# Patient Record
Sex: Female | Born: 1970 | Race: White | Hispanic: No | Smoking: Current every day smoker
Health system: Southern US, Community
[De-identification: ages and names within clinical notes are randomized; demographics above are authoritative.]

## PROBLEM LIST (undated history)

## (undated) DIAGNOSIS — K5190 Ulcerative colitis, unspecified, without complications: Secondary | ICD-10-CM

## (undated) HISTORY — PX: EYE SURGERY: SHX253

---

## 2007-05-27 ENCOUNTER — Emergency Department (HOSPITAL_COMMUNITY): Admission: EM | Admit: 2007-05-27 | Discharge: 2007-05-27 | Payer: Self-pay | Admitting: *Deleted

## 2007-06-09 ENCOUNTER — Ambulatory Visit: Payer: Self-pay | Admitting: Family Medicine

## 2007-06-16 ENCOUNTER — Ambulatory Visit: Payer: Self-pay | Admitting: Family Medicine

## 2010-08-09 ENCOUNTER — Inpatient Hospital Stay (HOSPITAL_COMMUNITY): Admission: EM | Admit: 2010-08-09 | Discharge: 2010-08-13 | Payer: Self-pay | Admitting: Emergency Medicine

## 2010-09-11 ENCOUNTER — Ambulatory Visit: Payer: Self-pay | Admitting: Internal Medicine

## 2010-09-11 ENCOUNTER — Encounter: Payer: Self-pay | Admitting: Physician Assistant

## 2010-09-11 DIAGNOSIS — D509 Iron deficiency anemia, unspecified: Secondary | ICD-10-CM | POA: Insufficient documentation

## 2010-09-11 DIAGNOSIS — E8809 Other disorders of plasma-protein metabolism, not elsewhere classified: Secondary | ICD-10-CM | POA: Insufficient documentation

## 2010-09-11 DIAGNOSIS — Z8719 Personal history of other diseases of the digestive system: Secondary | ICD-10-CM

## 2010-09-11 DIAGNOSIS — D539 Nutritional anemia, unspecified: Secondary | ICD-10-CM | POA: Insufficient documentation

## 2010-09-12 LAB — CONVERTED CEMR LAB
AST: 13 units/L (ref 0–37)
Albumin: 4.5 g/dL (ref 3.5–5.2)
BUN: 11 mg/dL (ref 6–23)
Basophils Absolute: 0.1 10*3/uL (ref 0.0–0.1)
Chloride: 107 meq/L (ref 96–112)
Creatinine, Ser: 0.74 mg/dL (ref 0.40–1.20)
Eosinophils Relative: 2 % (ref 0–5)
Glucose, Bld: 75 mg/dL (ref 70–99)
HCT: 41.1 % (ref 36.0–46.0)
Hemoglobin: 14.5 g/dL (ref 12.0–15.0)
Platelets: 243 10*3/uL (ref 150–400)
RBC: 4.13 M/uL (ref 3.87–5.11)
RDW: 11.8 % (ref 11.5–15.5)
WBC: 10 10*3/uL (ref 4.0–10.5)

## 2010-09-26 ENCOUNTER — Encounter: Payer: Self-pay | Admitting: Physician Assistant

## 2011-01-11 NOTE — Assessment & Plan Note (Signed)
Summary: XFU-Colitis; Anemia   Vital Signs:  Patient profile:   40 year old female Height:      65 inches Weight:      128 pounds BMI:     21.38 Temp:     98.1 degrees F oral Pulse rate:   68 / minute Pulse rhythm:   regular Resp:     18 per minute BP sitting:   110 / 68  (left arm) Cuff size:   regular  Vitals Entered By: [place] Shannon (September 11, 2010 3:09 PM) CC: xf/u.... pt wants to know what she can and cannot eat... Is Patient Diabetic? No Pain Assessment Patient in pain? no       Does patient need assistance? Functional Status Self care Ambulation Normal   Primary Care Provider:  Tereso Newcomer, PA-C  CC:  xf/u.... pt wants to know what she can and cannot eat....  History of Present Illness: New patient.  XFU.  Just d/c from [facility] with colitis, likely infectious etiology.  CT results noted below.  O&P and stool culture was negative.  CDiff x 3 was neg.  Hepatitis panel was neg.  HIV was nonreactive.  Her MCV was high.  Her B12 was borderline low.  Her methylmalonic acid was not elevated.  She is taking B12 once daily.  Her hgb was low and her iron levels were low.  She was put on ferrous sulfate.  She had a low protein and albumin.  She notes her diet is ok.  She had an elevated Tbil but f/u labs were WNL  Today she notes she is doing well. Was told not to eat certain things.  That is her only question.  She denies diarrhea.  No bloody stools.  No melena.  No abdominal pain.  She feels back to normal.  She does have a distant family member with Crohns.  No h/o oral ulcers.  No h/o perirectal abscess.    Habits & Providers  Alcohol-Tobacco-Diet     Tobacco Status: current  Exercise-Depression-Behavior     Drug Use: no  Current Medications (verified): 1)  Ferrous Sulfate 325 (65 Fe) Mg Tabs (Ferrous Sulfate) .... One Tab Three Times A Day 2)  Vitamin B-12 1000 Mcg Tabs (Cyanocobalamin) .... Once Daily 3)  Acetaminophen 325 Mg Tabs (Acetaminophen) .... As  Needed  Allergies (verified): No Known Drug Allergies  Past History:  Past Medical History: Anemia-iron deficiency Borderline low B12 (normal MMA level) Macrocytosis  Past Surgical History: eye surgery  Family History: Aunt- Crohn's Dad -prostate CA Grandmother - ?colon CA CAD - Dad s/p PCI; GM s/p PCI DM - grandmother; mom (borderline)  Social History: originally from [place] . . .moved here 4 years ago Current Smoker Alcohol use-no Drug use-no Divorced no kids Occupation: works in Office manager  Smoking Status:  current Drug Use:  no Occupation:  employed  Review of Systems      See HPI General:  Denies chills and fever. CV:  Denies chest pain or discomfort and shortness of breath with exertion. Resp:  Denies cough. GI:  Denies bloody stools, dark tarry stools, and diarrhea. GU:  Denies dysuria.  Physical Exam  General:  alert, well-developed, and well-nourished.   Head:  normocephalic and atraumatic.   Eyes:  pupils equal, pupils round, pupils reactive to light, and no retinal abnormalitiies.   Mouth:  pharynx pink and moist, extremely poor dentition, and teeth missing.   Neck:  supple and no cervical lymphadenopathy.   Lungs:  normal breath sounds, no crackles, and no wheezes.   Heart:  normal rate and regular rhythm.   Abdomen:  soft, non-tender, normal bowel sounds, and no hepatomegaly.   Extremities:  no edema  Neurologic:  alert & oriented X3 and cranial nerves II-XII intact.   Psych:  normally interactive.     Impression & Recommendations:  Problem # 1:  COLITIS, HX OF (ICD-V12.79)  prob infectious has family hx of Crohn's but no first degree relatives w/u essentially negative in the hospital will refer to GI for eval and decide +/- colonoscopy get stool cards  Orders: T-Hemoccult Cards-Multiple ([address]) Gastroenterology Referral (GI)  Problem # 2:  ANEMIA-IRON DEFICIENCY (ICD-280.9)  has heavy cycles ? some of her anemia related to poor  dentition sched CPP continue iron f/u labs today  Her updated medication list for this problem includes:    Ferrous Sulfate 325 (65 Fe) Mg Tabs (Ferrous sulfate) ..... One tab three times a day    Vitamin B-12 1000 Mcg Tabs (Cyanocobalamin) ..... Once daily  Orders: T-CBC w/Diff ([id number])  Problem # 3:  MACROCYTIC ANEMIA (ICD-281.9)  check homocysteine level high MCV may also be related to smoking hx  Her updated medication list for this problem includes:    Ferrous Sulfate 325 (65 Fe) Mg Tabs (Ferrous sulfate) ..... One tab three times a day    Vitamin B-12 1000 Mcg Tabs (Cyanocobalamin) ..... 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 One tab three times a day 2)  Vitamin B-12 1000 Mcg Tabs (Cyanocobalamin) .... Once daily 3)  Acetaminophen 325 Mg Tabs (Acetaminophen) .... As needed  Patient Instructions: 1)  You can slowly advance your diet back to a regular diet. 2)  Tobacco is very bad for your health and your loved ones ! You should stop smoking !  3)  Stop smoking tips: Choose a quit date. Cut down before the quit date. Decide what you will do as a substitute when you feel the urge to smoke(gum, toothpick, exercise).  4)  Please schedule a follow-up appointment in 2 months for CPP.  5)  Someone will call you to arrange an appt with the gastroenterologist.   CT of Abdomen  Procedure date:  08/09/2010  Findings:      IMPRESSION:    1.  Marked circumferential thickening of the walls of the right   colon with mild adjacent inflammatory change and adenopathy.   Findings most consistent with focal colitis  in this clinical   context; infectious colitis and Crohn's disease are leading   considerations.  The terminal hilum appears normal.   2.  No evidence of appendicitis or abscess.   3.  Mild prominence of the left renal collecting system, likely   developmental.  There is no hydronephrosis.  [place] of Abdomen  Procedure date:  08/09/2010  Findings:      IMPRESSION:   Starry-sky appearance of the liver which can be seen with   hepatitis.  Recommend clinical correlation and correlation with   LFTs.    Gallbladder normal.  Appended Document: XFU-Colitis; Anemia  Laboratory Results    Stool - Occult Blood Hemmoccult #1: negative Date: 09/25/2010 Hemoccult #2: negative Date: 09/25/2010 Hemoccult #3:  negative Date: 09/25/2010

## 2011-01-11 NOTE — Letter (Signed)
Summary: *HSN Results Follow up  [facility]  [address] [place], [place] [address]   Phone: [phone]  Fax: [phone]      09/26/2010   [facility] [address] [place], [place]  [address]   Dear  Ms. Sandi Haan,                            ____S.Drinkard,FNP   ____D. Gore,FNP       ____B. McPherson,MD   ____V. Rankins,MD    ____E. Mulberry,MD    ____N. Daphine Deutscher, FNP  ____D. Reche Dixon, MD    ____K. Philipp Deputy, MD    __x__S. Alben Spittle, PA-C     This letter is to inform you that your recent test(s):  _______Pap Smear    ___x____Lab Test     _______X-ray    ___x____ is within acceptable limits  _______ requires a medication change  _______ requires a follow-up lab visit  _______ requires a follow-up visit with your Ramya Vanbergen   Comments:  Stool cards are negative for blood.       _________________________________________________________ If you have any questions, please contact our office                     Sincerely,  Tereso Newcomer PA-C [facility]

## 2011-02-22 LAB — FECAL LACTOFERRIN, QUANT: Fecal Lactoferrin: POSITIVE

## 2011-02-22 LAB — CBC
HCT: 31.1 % — ABNORMAL LOW (ref 36.0–46.0)
HCT: 34.9 % — ABNORMAL LOW (ref 36.0–46.0)
Hemoglobin: 10.8 g/dL — ABNORMAL LOW (ref 12.0–15.0)
Hemoglobin: 11.7 g/dL — ABNORMAL LOW (ref 12.0–15.0)
Hemoglobin: 12.1 g/dL (ref 12.0–15.0)
MCH: 35.2 pg — ABNORMAL HIGH (ref 26.0–34.0)
MCV: 103.5 fL — ABNORMAL HIGH (ref 78.0–100.0)
RBC: 3.01 MIL/uL — ABNORMAL LOW (ref 3.87–5.11)
RBC: 3.26 MIL/uL — ABNORMAL LOW (ref 3.87–5.11)
RDW: 12 % (ref 11.5–15.5)
RDW: 12 % (ref 11.5–15.5)
WBC: 5 10*3/uL (ref 4.0–10.5)
WBC: 9.3 10*3/uL (ref 4.0–10.5)

## 2011-02-22 LAB — DIFFERENTIAL
Eosinophils Absolute: 0.1 10*3/uL (ref 0.0–0.7)
Eosinophils Relative: 1 % (ref 0–5)
Eosinophils Relative: 4 % (ref 0–5)
Lymphocytes Relative: 29 % (ref 12–46)
Lymphs Abs: 0.9 10*3/uL (ref 0.7–4.0)
Lymphs Abs: 1.4 10*3/uL (ref 0.7–4.0)
Monocytes Absolute: 0.4 10*3/uL (ref 0.1–1.0)
Monocytes Relative: 8 % (ref 3–12)
Neutro Abs: 2.9 10*3/uL (ref 1.7–7.7)
Neutrophils Relative %: 87 % — ABNORMAL HIGH (ref 43–77)

## 2011-02-22 LAB — COMPREHENSIVE METABOLIC PANEL
ALT: 11 U/L (ref 0–35)
Albumin: 3.1 g/dL — ABNORMAL LOW (ref 3.5–5.2)
CO2: 24 mEq/L (ref 19–32)
Calcium: 8.1 mg/dL — ABNORMAL LOW (ref 8.4–10.5)
Creatinine, Ser: 0.69 mg/dL (ref 0.4–1.2)
GFR calc Af Amer: >60 mL/min (ref >60)
GFR calc non Af Amer: >60 mL/min (ref >60)
Potassium: 3.6 mEq/L (ref 3.5–5.1)

## 2011-02-22 LAB — HIV ANTIBODY (ROUTINE TESTING W REFLEX): HIV: NONREACTIVE

## 2011-02-22 LAB — RETICULOCYTES
Retic Count, Absolute: 38.3 10*3/uL (ref 19.0–186.0)
Retic Ct Pct: 1.1 % (ref 0.4–3.1)

## 2011-02-22 LAB — IRON AND TIBC: Iron: 11 ug/dL — ABNORMAL LOW (ref 42–135)

## 2011-02-22 LAB — STOOL CULTURE

## 2011-02-22 LAB — BASIC METABOLIC PANEL
BUN: 4 mg/dL — ABNORMAL LOW (ref 6–23)
CO2: 25 mEq/L (ref 19–32)
CO2: 26 mEq/L (ref 19–32)
Calcium: 8.6 mg/dL (ref 8.4–10.5)
Chloride: 115 mEq/L — ABNORMAL HIGH (ref 96–112)
Creatinine, Ser: 0.73 mg/dL (ref 0.4–1.2)
Creatinine, Ser: 0.74 mg/dL (ref 0.4–1.2)
Glucose, Bld: 110 mg/dL — ABNORMAL HIGH (ref 70–99)
Potassium: 3.9 mEq/L (ref 3.5–5.1)
Potassium: 4 mEq/L (ref 3.5–5.1)

## 2011-02-22 LAB — VITAMIN B12: Vitamin B-12: 261 pg/mL (ref 211–911)

## 2011-02-22 LAB — FOLATE: Folate: 4.7 ng/mL

## 2011-02-22 LAB — HEPATITIS PANEL, ACUTE: Hep B C IgM: NEGATIVE

## 2011-02-22 LAB — OVA AND PARASITE EXAMINATION

## 2011-02-22 LAB — CLOSTRIDIUM DIFFICILE EIA: C difficile Toxins A+B, EIA: NEGATIVE

## 2011-02-22 LAB — TECHNOLOGIST SMEAR REVIEW

## 2011-02-22 LAB — BILIRUBIN, FRACTIONATED(TOT/DIR/INDIR)
Indirect Bilirubin: 0.9 mg/dL (ref 0.3–0.9)
Total Bilirubin: 1.1 mg/dL (ref 0.3–1.2)

## 2011-02-22 LAB — FERRITIN: Ferritin: 107 ng/mL (ref 10–291)

## 2011-02-23 LAB — CBC
HCT: 41.6 % (ref 36.0–46.0)
Hemoglobin: 14.7 g/dL (ref 12.0–15.0)
MCHC: 35.3 g/dL (ref 30.0–36.0)
RDW: 11.8 % (ref 11.5–15.5)
WBC: 20 10*3/uL — ABNORMAL HIGH (ref 4.0–10.5)

## 2011-02-23 LAB — DIFFERENTIAL
Basophils Relative: 0 % (ref 0–1)
Eosinophils Absolute: 0.1 10*3/uL (ref 0.0–0.7)
Eosinophils Relative: 1 % (ref 0–5)
Lymphs Abs: 1.5 10*3/uL (ref 0.7–4.0)
Monocytes Relative: 4 % (ref 3–12)

## 2011-02-23 LAB — CULTURE, BLOOD (ROUTINE X 2)
Culture: NO GROWTH
Culture: NO GROWTH

## 2011-02-23 LAB — COMPREHENSIVE METABOLIC PANEL
ALT: 12 U/L (ref 0–35)
AST: 23 U/L (ref 0–37)
Alkaline Phosphatase: 54 U/L (ref 39–117)
CO2: 22 mEq/L (ref 19–32)
Calcium: 9.2 mg/dL (ref 8.4–10.5)
GFR calc Af Amer: >60 mL/min (ref >60)
Glucose, Bld: 101 mg/dL — ABNORMAL HIGH (ref 70–99)
Potassium: 4.9 mEq/L (ref 3.5–5.1)
Sodium: 140 mEq/L (ref 135–145)
Total Protein: 7.1 g/dL (ref 6.0–8.3)

## 2011-02-23 LAB — URINALYSIS, ROUTINE W REFLEX MICROSCOPIC
Bilirubin Urine: NEGATIVE
Glucose, UA: NEGATIVE mg/dL
Specific Gravity, Urine: 1.007 (ref 1.005–1.030)
Urobilinogen, UA: 0.2 mg/dL (ref 0.0–1.0)
pH: 6 (ref 5.0–8.0)

## 2011-02-23 LAB — PROTIME-INR
INR: 1.08 (ref 0.00–1.49)
Prothrombin Time: 14.2 seconds (ref 11.6–15.2)

## 2011-02-23 LAB — HEPATIC FUNCTION PANEL
Bilirubin, Direct: 0.6 mg/dL — ABNORMAL HIGH (ref 0.0–0.3)
Indirect Bilirubin: 1.5 mg/dL — ABNORMAL HIGH (ref 0.3–0.9)
Total Bilirubin: 2.1 mg/dL — ABNORMAL HIGH (ref 0.3–1.2)

## 2011-02-23 LAB — URINE MICROSCOPIC-ADD ON

## 2011-09-26 LAB — CBC
Hemoglobin: 14.7
MCHC: 34.6
Platelets: 239
RDW: 11.2 — ABNORMAL LOW

## 2011-09-26 LAB — COMPREHENSIVE METABOLIC PANEL
ALT: 12
Albumin: 4.1
Calcium: 9.8
GFR calc Af Amer: >60
Glucose, Bld: 117 — ABNORMAL HIGH
Sodium: 141
Total Protein: 7.3

## 2011-09-26 LAB — TSH: TSH: 2.24

## 2011-09-26 LAB — DIFFERENTIAL
Eosinophils Relative: 5
Lymphocytes Relative: 34
Lymphs Abs: 2.7
Monocytes Relative: 7

## 2011-09-26 LAB — LIPASE, BLOOD: Lipase: 37

## 2011-09-26 LAB — BILIRUBIN, DIRECT: Bilirubin, Direct: 0.1

## 2015-10-05 ENCOUNTER — Encounter (HOSPITAL_COMMUNITY): Payer: Self-pay | Admitting: Emergency Medicine

## 2015-10-05 ENCOUNTER — Emergency Department (HOSPITAL_COMMUNITY)
Admission: EM | Admit: 2015-10-05 | Discharge: 2015-10-05 | Disposition: A | Discharge status: Home / Self Care | Payer: Self-pay | Attending: Emergency Medicine | Admitting: Emergency Medicine

## 2015-10-05 DIAGNOSIS — L03213 Periorbital cellulitis: Secondary | ICD-10-CM

## 2015-10-05 DIAGNOSIS — Z3202 Encounter for pregnancy test, result negative: Secondary | ICD-10-CM | POA: Insufficient documentation

## 2015-10-05 DIAGNOSIS — H00036 Abscess of eyelid left eye, unspecified eyelid: Secondary | ICD-10-CM | POA: Insufficient documentation

## 2015-10-05 DIAGNOSIS — Z8719 Personal history of other diseases of the digestive system: Secondary | ICD-10-CM | POA: Insufficient documentation

## 2015-10-05 HISTORY — DX: Ulcerative colitis, unspecified, without complications: K51.90

## 2015-10-05 LAB — CBC WITH DIFFERENTIAL/PLATELET
BASOS ABS: 0 10*3/uL (ref 0.0–0.1)
BASOS PCT: 0 %
EOS PCT: 1 %
Eosinophils Absolute: 0.1 10*3/uL (ref 0.0–0.7)
HCT: 39.5 % (ref 36.0–46.0)
Hemoglobin: 13.7 g/dL (ref 12.0–15.0)
LYMPHS PCT: 21 %
Lymphs Abs: 2.2 10*3/uL (ref 0.7–4.0)
MCH: 34.3 pg — ABNORMAL HIGH (ref 26.0–34.0)
MCHC: 34.7 g/dL (ref 30.0–36.0)
MCV: 99 fL (ref 78.0–100.0)
MONO ABS: 1 10*3/uL (ref 0.1–1.0)
Monocytes Relative: 9 %
Neutro Abs: 7.4 10*3/uL (ref 1.7–7.7)
Neutrophils Relative %: 69 %
PLATELETS: 204 10*3/uL (ref 150–400)
RBC: 3.99 MIL/uL (ref 3.87–5.11)
RDW: 12.3 % (ref 11.5–15.5)
WBC: 10.7 10*3/uL — AB (ref 4.0–10.5)

## 2015-10-05 LAB — BASIC METABOLIC PANEL WITH GFR
Anion gap: 8 (ref 5–15)
BUN: 12 mg/dL (ref 6–20)
CO2: 24 mmol/L (ref 22–32)
Calcium: 9.5 mg/dL (ref 8.9–10.3)
Chloride: 107 mmol/L (ref 101–111)
Creatinine, Ser: 0.75 mg/dL (ref 0.44–1.00)
GFR calc Af Amer: >60 mL/min
GFR calc non Af Amer: >60 mL/min
Glucose, Bld: 117 mg/dL — ABNORMAL HIGH (ref 65–99)
Potassium: 4.2 mmol/L (ref 3.5–5.1)
Sodium: 139 mmol/L (ref 135–145)

## 2015-10-05 LAB — I-STAT TROPONIN, ED: TROPONIN I, POC: 0 ng/mL (ref 0.00–0.08)

## 2015-10-05 LAB — I-STAT BETA HCG BLOOD, ED (MC, WL, AP ONLY)

## 2015-10-05 MED ORDER — SULFAMETHOXAZOLE-TRIMETHOPRIM 800-160 MG PO TABS: 1.0000 | ORAL_TABLET | Freq: Two times a day (BID) | ORAL | Status: AC

## 2015-10-05 MED ORDER — CEPHALEXIN 500 MG PO CAPS: 500.0000 mg | ORAL_CAPSULE | Freq: Four times a day (QID) | ORAL | Status: DC

## 2015-10-05 MED ORDER — SODIUM CHLORIDE 0.9 % IV BOLUS (SEPSIS)
1000.0000 mL | Freq: Once | INTRAVENOUS | Status: AC
Start: 2015-10-05 — End: 2015-10-05
  Administered 2015-10-05: 1000 mL via INTRAVENOUS

## 2015-10-05 NOTE — ED Notes (Signed)
Pt alert,oriented, and ambulatory upon DC. She was advised and encouraged to take the full course of the antibiotics prescribed. [facility]

## 2015-10-05 NOTE — ED Provider Notes (Signed)
CSN: [id number]     Arrival date & time 10/05/15  2129 History   First MD Initiated Contact with Patient 10/05/15 2141     Chief Complaint  Patient presents with  . Facial Swelling  . Tachycardia     (Consider location/radiation/quality/duration/timing/severity/associated sxs/prior Treatment)  HPI  44 year old female who presents with left-sided facial swelling. Is otherwise healthy, and no prior history of immunosuppression. Reports yesterday waking up with some mild left-sided facial swelling around her  left eye and left cheek. This never happened to her before, did not go away today so came in for evaluation. He noted mild tenderness around this area with increased redness. Has not had any headaches, eye pain, decreased visual acuity, double vision,, eye redness. Has had some increased clear drainage from the left eye and increased clear drainage from her nose today. Denies any associating fevers, chills, nausea, vomiting, headache, dental pain, oral swelling, or difficulty breathing. Has been eating and drinking well otherwise. No trauma, no pruritis, no new allergens.  Past Medical History  Diagnosis Date  . Ulcerative colitis [facility](HCC)    Past Surgical History  Procedure Laterality Date  . Eye surgery     History reviewed. No pertinent family history. Social History  Substance Use Topics  . Smoking status: None  . Smokeless tobacco: None  . Alcohol Use: None   OB History    No data available     Review of Systems 10/14 systems reviewed and are negative other than those stated in the HPI    Allergies  Review of patient's allergies indicates no known allergies.  Home Medications   Prior to Admission medications   Medication Sig Start Date End Date Taking? Authorizing Provider  cephALEXin (KEFLEX) 500 MG capsule Take 1 capsule (500 mg total) by mouth 4 (four) times daily. 10/05/15   Lavera Guiseana Duo Adrean Heitz, MD  sulfamethoxazole-trimethoprim (BACTRIM DS,SEPTRA DS) 800-160 MG  tablet Take 1 tablet by mouth 2 (two) times daily. 10/05/15 10/12/15  Lavera Guiseana Duo Harlowe Dowler, MD   BP 115/62 mmHg  Pulse 120  Temp(Src) 97.8 F (36.6 C) (Oral)  Resp 20  SpO2 96% Physical Exam Physical Exam  Nursing note and vitals reviewed. Constitutional: Well developed, well nourished, non-toxic, and in no acute distress Head: Normocephalic and atraumatic. There is left periorbital swelling and mild erythema. Tender to palpation around this area. No open skin or drainage.  Mouth/Throat: Oropharynx is clear and moist.  Neck: Normal range of motion. Neck supple. No meningismus.   Cardiovascular: Tachycardic rate and regular rhythm.   Pulmonary/Chest: Effort normal and breath sounds normal.  Abdominal: Soft. There is no tenderness. There is no rebound and no guarding.  Musculoskeletal: Normal range of motion.  Neurological: Alert, no facial droop, fluent speech, moves all extremities symmetrically Skin: Skin is warm and dry.  Psychiatric: Cooperative  ED Course  Procedures (including critical care time) Labs Review Labs Reviewed  BASIC METABOLIC PANEL - Abnormal; Notable for the following:    Glucose, Bld 117 (*)    All other components within normal limits  CBC WITH DIFFERENTIAL/PLATELET - Abnormal; Notable for the following:    WBC 10.7 (*)    MCH 34.3 (*)    All other components within normal limits  I-STAT CG4 LACTIC ACID, ED  I-STAT BETA HCG BLOOD, ED ([facility], [facility], [facility] ONLY)  I-STAT TROPOININ, ED    Imaging Review No results found. I have personally reviewed and evaluated these images and lab results as part of my medical decision-making.  EKG Interpretation   Date/Time:  Wednesday October 05 2015 21:47:01 EDT Ventricular Rate:  122 PR Interval:  154 QRS Duration: 77 QT Interval:  293 QTC Calculation: 417 R Axis:   91 Text Interpretation:  Sinus tachycardia LAE, consider biatrial enlargement  Aside from tachycardia, no significant change from prior EKG Confirmed by  Vida Nicol  MD, Annabelle Harman ([id number]) on 10/05/2015 10:19:11 PM      MDM   Final diagnoses:  Periorbital cellulitis of left eye    44 year old female, otherwise healthy, who presents with left periorbital swelling and redness. Presentation suggestive of  preseptal cellulitis. No concern for orbital cellulitis, eyes no eye redness, eye pain, limited extraocular movements, or proptosis. She is well-appearing and nontoxic. Initially tachycardic in the 120s, but resolves with IV fluids. Is afebrile with normotension. Minimal leukocytosis, and I do not suspect systemic infection. No other symptoms that would suggest intracranial infection either. I felt that she is appropriate for outpatient management. she is given a course of Keflex and Bactrim. Strict return instructions are reviewed with this patient. She expressed understanding of all discharge instructions and felt comfortable to plan of care.   Lavera Guise, MD 10/05/15 [phone]

## 2015-10-05 NOTE — ED Notes (Signed)
Pt ambulated to restroom with steady gait.

## 2015-10-05 NOTE — Discharge Instructions (Signed)
Return without fail for worsening symptoms, including increased swelling or pain around her eye, eye pain or redness, severe headaches, vomiting unable to keep down food or fluids, fever, vision changes, or any other symptoms concerning to you. Please take antibiotics as prescribed.

## 2015-10-05 NOTE — ED Notes (Signed)
Pt states yesterday began having left sided periorbital swelling increasing into today. Mild swelling around eyelid/left sinus area. Denies injury, difficulty breathing. Pt also tachycardic at 120, states she does not feel like her heart is racing.

## 2015-10-05 NOTE — ED Notes (Signed)
MD at bedside. 

## 2016-05-22 ENCOUNTER — Emergency Department (HOSPITAL_COMMUNITY)
Admission: EM | Admit: 2016-05-22 | Discharge: 2016-05-22 | Disposition: A | Discharge status: Home / Self Care | Payer: Self-pay | Attending: Emergency Medicine | Admitting: Emergency Medicine

## 2016-05-22 ENCOUNTER — Encounter (HOSPITAL_COMMUNITY): Payer: Self-pay | Admitting: Emergency Medicine

## 2016-05-22 DIAGNOSIS — K047 Periapical abscess without sinus: Secondary | ICD-10-CM | POA: Insufficient documentation

## 2016-05-22 MED ORDER — PENICILLIN V POTASSIUM 500 MG PO TABS
500.0000 mg | ORAL_TABLET | Freq: Three times a day (TID) | ORAL | Status: DC
Start: 2016-05-22 — End: 2019-04-23

## 2016-05-22 MED ORDER — PENICILLIN V POTASSIUM 500 MG PO TABS
500.0000 mg | ORAL_TABLET | Freq: Once | ORAL | Status: AC
  Administered 2016-05-22: 500 mg via ORAL
  Filled 2016-05-22: qty 1

## 2016-05-22 NOTE — Discharge Instructions (Signed)
Dental Abscess °A dental abscess is a collection of pus in or around a tooth. °CAUSES °This condition is caused by a bacterial infection around the root of the tooth that involves the inner part of the tooth (pulp). It may result from: °· Severe tooth decay. °· Trauma to the tooth that allows bacteria to enter into the pulp, such as a broken or chipped tooth. °· Severe gum disease around a tooth. °SYMPTOMS °Symptoms of this condition include: °· Severe pain in and around the infected tooth. °· Swelling and redness around the infected tooth, in the mouth, or in the face. °· Tenderness. °· Pus drainage. °· Bad breath. °· Bitter taste in the mouth. °· Difficulty swallowing. °· Difficulty opening the mouth. °· Nausea. °· Vomiting. °· Chills. °· Swollen neck glands. °· Fever. °DIAGNOSIS °This condition is diagnosed with examination of the infected tooth. During the exam, your dentist may tap on the infected tooth. Your dentist will also ask about your medical and dental history and may order X-rays. °TREATMENT °This condition is treated by eliminating the infection. This may be done with: °· Antibiotic medicine. °· A root canal. This may be performed to save the tooth. °· Pulling (extracting) the tooth. This may also involve draining the abscess. This is done if the tooth cannot be saved. °HOME CARE INSTRUCTIONS °· Take medicines only as directed by your dentist. °· If you were prescribed antibiotic medicine, finish all of it even if you start to feel better. °· Rinse your mouth (gargle) often with salt water to relieve pain or swelling. °· Do not drive or operate heavy machinery while taking pain medicine. °· Do not apply heat to the outside of your mouth. °· Keep all follow-up visits as directed by your dentist. This is important. °SEEK MEDICAL CARE IF: °· Your pain is worse and is not helped by medicine. °SEEK IMMEDIATE MEDICAL CARE IF: °· You have a fever or chills. °· Your symptoms suddenly get worse. °· You have a  very bad headache. °· You have problems breathing or swallowing. °· You have trouble opening your mouth. °· You have swelling in your neck or around your eye. °  °This information is not intended to replace advice given to you by your health care provider. Make sure you discuss any questions you have with your health care provider. °  °Document Released: 11/26/2005 Document Revised: 04/12/2015 Document Reviewed: 11/23/2014 °Elsevier Interactive Patient Education ©2016 Elsevier Inc. ° °Dental Care and Dentist Visits °Dental care supports good overall health. Regular dental visits can also help you avoid dental pain, bleeding, infection, and other more serious health problems in the future. It is important to keep the mouth healthy because diseases in the teeth, gums, and other oral tissues can spread to other areas of the body. Some problems, such as diabetes, heart disease, and pre-term labor have been associated with poor oral health.  °See your dentist every 6 months. If you experience emergency problems such as a toothache or broken tooth, go to the dentist right away. If you see your dentist regularly, you may catch problems early. It is easier to be treated for problems in the early stages.  °WHAT TO EXPECT AT A DENTIST VISIT  °Your dentist will look for many common oral health problems and recommend proper treatment. At your regular dental visit, you can expect: °· Gentle cleaning of the teeth and gums. This includes scraping and polishing. This helps to remove the sticky substance around the teeth and gums (  plaque). Plaque forms in the mouth shortly after eating. Over time, plaque hardens on the teeth as tartar. If tartar is not removed regularly, it can cause problems. Cleaning also helps remove stains.  Periodic X-rays. These pictures of the teeth and supporting bone will help your dentist assess the health of your teeth.  Periodic fluoride treatments. Fluoride is a natural mineral shown to help  strengthen teeth. Fluoride treatmentinvolves applying a fluoride gel or varnish to the teeth. It is most commonly done in children.  Examination of the mouth, tongue, jaws, teeth, and gums to look for any oral health problems, such as:  Cavities (dental caries). This is decay on the tooth caused by plaque, sugar, and acid in the mouth. It is best to catch a cavity when it is small.  Inflammation of the gums caused by plaque buildup (gingivitis).  Problems with the mouth or malformed or misaligned teeth.  Oral cancer or other diseases of the soft tissues or jaws. KEEP YOUR TEETH AND GUMS HEALTHY For healthy teeth and gums, follow these general guidelines as well as your dentist's specific advice:  Have your teeth professionally cleaned at the dentist every 6 months.  Brush twice daily with a fluoride toothpaste.  Floss your teeth daily.  Ask your dentist if you need fluoride supplements, treatments, or fluoride toothpaste.  Eat a healthy diet. Reduce foods and drinks with added sugar.  Avoid smoking. TREATMENT FOR ORAL HEALTH PROBLEMS If you have oral health problems, treatment varies depending on the conditions present in your teeth and gums.  Your caregiver will most likely recommend good oral hygiene at each visit.  For cavities, gingivitis, or other oral health disease, your caregiver will perform a procedure to treat the problem. This is typically done at a separate appointment. Sometimes your caregiver will refer you to another dental specialist for specific tooth problems or for surgery. SEEK IMMEDIATE DENTAL CARE IF:  You have pain, bleeding, or soreness in the gum, tooth, jaw, or mouth area.  A permanent tooth becomes loose or separated from the gum socket.  You experience a blow or injury to the mouth or jaw area.   This information is not intended to replace advice given to you by your health care provider. Make sure you discuss any questions you have with your  health care provider.   Document Released: 08/08/2011 Document Revised: 02/18/2012 Document Reviewed: 08/08/2011 Elsevier Interactive Patient Education 2016 [employer] Guide Dental The United [employer] 211 is a great source of information about community services available.  Access by dialing 2-1-1 from anywhere in [place], or by website -  PooledIncome.plwww.nc211.org.   Other Local Resources (Updated 12/2015)  Dental  Care   Services    Phone Number and Address  Cost  [facility] For children 500 - 46 years of age:   Cleaning  Tooth brushing/flossing instruction  Sealants, fillings, crowns  Extractions  Emergency treatment  [phone] [address] [place], [place] [address] Charges based on family income.  Medicaid and some insurance plans accepted.     [facility]  Cleaning  Sealants, fillings, crowns  Extractions  Emergency treatment [phone] [address] [place], [place]  Pregnant women 46 years of age or older with a Medicaid card  [facility] [place]  Cleaning  Sealants, fillings, crowns  Extractions  Emergency treatment [phone] [address] [place], [place] Pregnant women 18  years of age or older with a Medicaid card  [facility] For children 250 - 46 years of age:   Cleaning  Tooth brushing/flossing instruction  Sealants, fillings, crowns  Extractions  Emergency treatment Limited orthodontic services for patients with Medicaid [phone] [address] [place], [place] [address] Medicaid and [facility] Health Choice cover for children up to age 46 and pregnant women.  Parents of children up to age 46 without Medicaid pay a reduced fee at time of service.  [facility] [place] For children 60 - 46 years of age:    Cleaning  Tooth brushing/flossing instruction  Sealants, fillings, crowns  Extractions  Emergency treatment Limited orthodontic services for patients with Medicaid [phone] [address] [place], [place].  Medicaid and [place] Health Choice cover for children up to age 46 and pregnant women.  Parents of children up to age 46 without Medicaid pay a reduced fee.  [facility]  Cleaning  Sealants, fillings, crowns  Extractions  Hours: Tuesdays and Thursdays, 4:15 - 8 pm [phone] [address] [place], [place] [address] Services free of charge to [facility] residents ages 18-64 who do not have health insurance, Medicare, [place], or [place] benefits and fall within federal poverty guidelines  [facility]    Provides dental care in addition to primary medical care, nutritional counseling, and pharmacy:  Nurse, mental healthCleaning  Sealants, fillings, crowns  Extractions                  [phone] [facility], [address] [place], [place]  [phone] Phineas Realharles [facility], [address] [place], [place]  [phone] [facility] [place], [place]  [phone] [facility], [address] [place], [place]  [phone] [facility] [address] [place], [place] Accepts [place], [place]are, most insurance.  Also provides services available to all with fees adjusted based on ability to pay.    [facility]  Cleaning  Tooth brushing/flossing instruction  Sealants, fillings, crowns  Extractions  Emergency treatment Hours: Tuesdays, Thursdays, and Fridays from 8 am to 5 pm by appointment only. [phone] [address] [place], [place] [address] [facility] residents with Medicaid (depending on eligibility) and children with [facility] Health Choice - call for more  information.  [facility]  Extractions only  Hours: 2nd and 4th Thursday of each month from 6:30 am - 9 am.   [phone] [address] [place], [place] [address] Ages 7118 and older only.  Patients are seen on a first come, first served basis.  [employer]  [employer]  Fillings  Extractions  Orthodontics  Endodontics  Implants/Crowns/Bridges  Complete and partial dentures [phone] [place] Patients must complete an application for services.  There is often a waiting list.

## 2016-05-22 NOTE — ED Notes (Signed)
Pt states that she started having R sided facial swelling today. States she has had this before on the L side with a dental issue. No pain this time. Alert and oriented. Airway intact.

## 2016-05-22 NOTE — ED Provider Notes (Signed)
CSN: [id number]     Arrival date & time 05/22/16  0135 History   First MD Initiated Contact with Patient 05/22/16 0404     Chief Complaint  Patient presents with  . Facial Swelling     (Consider location/radiation/quality/duration/timing/severity/associated sxs/prior Treatment) HPI Comments: Patient presents with complaint of right facial swelling and swollen gums for the past 1-2 days. No fever. She denies significant pain, nasal pain or congestion, drainage from gum line or sore throat/difficulty swallowing. No nausea or vomiting.   The history is provided by the patient. No language interpreter was used.    Past Medical History  Diagnosis Date  . Ulcerative colitis [facility](HCC)    Past Surgical History  Procedure Laterality Date  . Eye surgery     History reviewed. No pertinent family history. Social History  Substance Use Topics  . Smoking status: None  . Smokeless tobacco: None  . Alcohol Use: None   OB History    No data available     Review of Systems  Constitutional: Negative for fever and chills.  HENT: Positive for dental problem and facial swelling. Negative for ear pain and trouble swallowing.   Gastrointestinal: Negative.  Negative for nausea and vomiting.  Neurological: Negative.  Negative for headaches.      Allergies  Review of patient's allergies indicates no known allergies.  Home Medications   Prior to Admission medications   Medication Sig Start Date End Date Taking? Authorizing Provider  naproxen sodium (ANAPROX) 220 MG tablet Take 440 mg by mouth 2 (two) times daily as needed (pain).   Yes Historical Provider, MD  cephALEXin (KEFLEX) 500 MG capsule Take 1 capsule (500 mg total) by mouth 4 (four) times daily. Patient not taking: Reported on 05/22/2016 10/05/15   Lavera Guiseana Duo Liu, MD   BP 142/78 mmHg  Pulse 111  Temp(Src) 97.8 F (36.6 C) (Oral)  Resp 18  Ht 5\' 5"  (1.651 m)  SpO2 100%  LMP 05/13/2016 (Exact Date) Physical Exam  Constitutional:  She is oriented to person, place, and time. She appears well-developed and well-nourished.  HENT:  Mouth/Throat: Oropharynx is clear and moist.  Widespread dental decay. There is marked swelling along upper dental line. No active drainage. Right facial swelling of cheek.   Neck: Normal range of motion.  Pulmonary/Chest: Effort normal.  Neurological: She is alert and oriented to person, place, and time.  Skin: Skin is warm and dry.    ED Course  Procedures (including critical care time) Labs Review Labs Reviewed - No data to display  Imaging Review No results found. I have personally reviewed and evaluated these images and lab results as part of my medical decision-making.   EKG Interpretation None      MDM   Final diagnoses:  None    1. Dental abscess  Patient presents with right sided facial and gum swelling without pain or fever. There is marked dental decay and alveolar ridge swelling c/w abscess. Will treat with PCN and provided dental clinic resources.     Elpidio AnisShari Jaleal Schliep, PA-C 05/22/16 [id number]  April Palumbo, MD 05/22/16 [phone]

## 2019-04-23 ENCOUNTER — Ambulatory Visit (HOSPITAL_COMMUNITY)
Admission: EM | Admit: 2019-04-23 | Discharge: 2019-04-23 | Disposition: A | Discharge status: Home / Self Care | Payer: Self-pay

## 2019-04-23 ENCOUNTER — Other Ambulatory Visit: Payer: Self-pay

## 2019-04-23 ENCOUNTER — Encounter (HOSPITAL_COMMUNITY): Payer: Self-pay | Admitting: Family Medicine

## 2019-04-23 DIAGNOSIS — T148XXA Other injury of unspecified body region, initial encounter: Secondary | ICD-10-CM

## 2019-04-23 NOTE — ED Triage Notes (Signed)
Pt present a bump on her right leg upper thigh area. Pt states the bump has been there a while but gotten worst to touch and painful while moving leg.

## 2019-04-23 NOTE — ED Provider Notes (Signed)
[facility]    CSN: [id number] Arrival date & time: 04/23/19  1250     History   Chief Complaint Chief Complaint  Patient presents with  . Leg Swelling    Right     HPI Sheral ApleySandra M Sassano is a 48 y.o. female.   Patient is a 48 year old female with no significant past medical history.  She presents today with knot to the right upper leg just above the knee.  This is been present and worsening over the past week.  Reports it started as a small area and a few days ago she was stretching in bed and felt sharp pain in the area.  Since there has been more swelling, bruising and intermittent pains.  She has been taking Aleve for pain.  She has also been using heat to the area.  She left the heating pad on there all night last night.  She woke up this morning with worsening pain.  Denies any specific injuries.  Denies any fevers, chills, body aches, dizziness or weakness.  ROS per HPI      Past Medical History:  Diagnosis Date  . Ulcerative colitis [facility](HCC)     Patient Active Problem List   Diagnosis Date Noted  . 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 COLITIS, HX OF 09/11/2010    Past Surgical History:  Procedure Laterality Date  . EYE SURGERY      OB History   No obstetric history on file.      Home Medications    Prior to Admission medications   Medication Sig Start Date End Date Taking? Authorizing Provider  naproxen sodium (ANAPROX) 220 MG tablet Take 440 mg by mouth 2 (two) times daily as needed (pain).    [provider]    Family History History reviewed. No pertinent family history.  Social History Social History   Tobacco Use  . Smoking status: Current Every Day Smoker  . Smokeless tobacco: Current User  Substance Use Topics  . Alcohol use: Not on file  . Drug use: Not on file     Allergies   Patient has no known allergies.   Review of Systems Review of Systems   Physical Exam Triage Vital Signs ED Triage Vitals [04/23/19 1331]  Enc Vitals Group     BP 134/82     Pulse Rate (!) 103     Resp 16     Temp 97.9 F (36.6 C)     Temp Source Oral     SpO2 100 %     Weight      Height      Head Circumference      Peak Flow      Pain Score 5     Pain Loc      Pain Edu?      Excl. in GC?    No data found.  Updated Vital Signs BP 134/82 (BP Location: Right Arm)   Pulse (!) 103   Temp 97.9 F (36.6 C) (Oral)   Resp 16   LMP 05/13/2016 (Exact Date)   SpO2 100%   Visual Acuity Right Eye Distance:   Left Eye Distance:   Bilateral Distance:    Right Eye Near:   Left Eye Near:    Bilateral Near:     Physical Exam Vitals signs and nursing note reviewed.  Constitutional:      General: She is not in acute distress.  Appearance: Normal appearance. She is not ill-appearing, toxic-appearing or diaphoretic.  HENT:     Head: Normocephalic and atraumatic.     Nose: Nose normal.  Neck:     Musculoskeletal: Normal range of motion.  Cardiovascular:     Rate and Rhythm: Normal rate and regular rhythm.     Pulses: Normal pulses.  Pulmonary:     Effort: Pulmonary effort is normal.     Breath sounds: Normal breath sounds.  Musculoskeletal: Normal range of motion.  Skin:    General: Skin is warm and dry.  Neurological:     Mental Status: She is alert.  Psychiatric:        Mood and Affect: Mood normal.          UC Treatments / Results  Labs (all labs ordered are listed, but only abnormal results are displayed) Labs Reviewed - No data to display  EKG None  Radiology No results found.  Procedures Procedures (including critical care time)  Medications Ordered in UC Medications - No data to display  Initial Impression / Assessment and Plan / UC Course  I have reviewed the triage vital signs and the nursing notes.  Pertinent labs & imaging results that were available during my care of the patient were reviewed by me and  considered in my medical decision making (see chart for details).     Symptoms consistent with hematoma.  Recommended using ice instead of heat Believe that heat may have made it worse Watch closely over the next couple days for worsening or spreading Aleve for pain Follow up as needed for continued or worsening symptoms  Final Clinical Impressions(s) / UC Diagnoses   Final diagnoses:  Hematoma     Discharge Instructions     I believe  this is a hematoma Start icing instead of heat.  You can take aleve for pain as needed.  Rest the leg Follow up as needed for continued or worsening symptoms     ED Prescriptions    None     Controlled Substance Prescriptions [place] Controlled Substance Registry consulted? Not Applicable   Janace Aris, NP 04/23/19 1519

## 2019-04-23 NOTE — Discharge Instructions (Signed)
I believe  this is a hematoma Start icing instead of heat.  You can take aleve for pain as needed.  Rest the leg Follow up as needed for continued or worsening symptoms

## 2021-03-25 ENCOUNTER — Other Ambulatory Visit: Payer: Self-pay

## 2021-03-25 ENCOUNTER — Emergency Department (HOSPITAL_COMMUNITY): Payer: Self-pay

## 2021-03-25 ENCOUNTER — Emergency Department (HOSPITAL_COMMUNITY)
Admission: EM | Admit: 2021-03-25 | Discharge: 2021-03-25 | Disposition: A | Discharge status: Home / Self Care | Payer: Self-pay | Attending: Emergency Medicine | Admitting: Emergency Medicine

## 2021-03-25 DIAGNOSIS — S8011XA Contusion of right lower leg, initial encounter: Secondary | ICD-10-CM

## 2021-03-25 DIAGNOSIS — S7011XA Contusion of right thigh, initial encounter: Secondary | ICD-10-CM | POA: Insufficient documentation

## 2021-03-25 DIAGNOSIS — Y990 Civilian activity done for income or pay: Secondary | ICD-10-CM | POA: Insufficient documentation

## 2021-03-25 DIAGNOSIS — S60211A Contusion of right wrist, initial encounter: Secondary | ICD-10-CM | POA: Insufficient documentation

## 2021-03-25 DIAGNOSIS — S60221A Contusion of right hand, initial encounter: Secondary | ICD-10-CM

## 2021-03-25 DIAGNOSIS — S60052A Contusion of left little finger without damage to nail, initial encounter: Secondary | ICD-10-CM | POA: Insufficient documentation

## 2021-03-25 DIAGNOSIS — S60012A Contusion of left thumb without damage to nail, initial encounter: Secondary | ICD-10-CM | POA: Insufficient documentation

## 2021-03-25 DIAGNOSIS — W19XXXA Unspecified fall, initial encounter: Secondary | ICD-10-CM

## 2021-03-25 DIAGNOSIS — W010XXA Fall on same level from slipping, tripping and stumbling without subsequent striking against object, initial encounter: Secondary | ICD-10-CM | POA: Insufficient documentation

## 2021-03-25 DIAGNOSIS — F17200 Nicotine dependence, unspecified, uncomplicated: Secondary | ICD-10-CM | POA: Insufficient documentation

## 2021-03-25 DIAGNOSIS — S6000XA Contusion of unspecified finger without damage to nail, initial encounter: Secondary | ICD-10-CM

## 2021-03-25 MED ORDER — IBUPROFEN 800 MG PO TABS
800.0000 mg | ORAL_TABLET | Freq: Once | ORAL | Status: AC
  Administered 2021-03-25: 800 mg via ORAL
  Filled 2021-03-25: qty 1

## 2021-03-25 NOTE — Discharge Instructions (Addendum)
Apply cold compresses to sore areas for 20 minutes at a time. Take Motrin and Tylenol as needed as directed for pain. Recheck with your doctor in 1 week if pain persists or is not improving.

## 2021-03-25 NOTE — ED Provider Notes (Signed)
[facility]-EMERGENCY DEPT Provider Note   CSN: [id number] Arrival date & time: 03/25/21  9024     History Chief Complaint  Patient presents with  . Fall  . Arm Injury    Michele Vazquez is a 50 y.o. female.  50 year old female presents for evaluation after a fall which occurred yesterday.  Patient was walking through a parking lot when her right knee gave out causing her to fall onto a cement parking block.  Reports pain in her left distal fifth finger and thumb as well as right wrist and right thigh.  Patient has been ambulatory without difficulty since the fall, did not hit head, did not lose consciousness, is not anticoagulated.  No other injuries, complaints, concerns.        Past Medical History:  Diagnosis Date  . Ulcerative colitis [facility])     Patient Active Problem List   Diagnosis Date Noted  . 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 COLITIS, HX OF 09/11/2010    Past Surgical History:  Procedure Laterality Date  . EYE SURGERY       OB History   No obstetric history on file.     No family history on file.  Social History   Tobacco Use  . Smoking status: Current Every Day Smoker  . Smokeless tobacco: Current User    Home Medications Prior to Admission medications   Medication Sig Start Date End Date Taking? Authorizing Provider  naproxen sodium (ANAPROX) 220 MG tablet Take 440 mg by mouth 2 (two) times daily as needed (pain).    [provider]    Allergies    Patient has no known allergies.  Review of Systems   Review of Systems  Constitutional: Negative for fever.  Musculoskeletal: Positive for arthralgias and myalgias. Negative for back pain, gait problem, joint swelling, neck pain and neck stiffness.  Skin: Positive for wound.  Allergic/Immunologic: Negative for immunocompromised state.  Neurological: Negative for dizziness, weakness,  light-headedness, numbness and headaches.  Hematological: Does not bruise/bleed easily.  Psychiatric/Behavioral: Negative for confusion.  All other systems reviewed and are negative.   Physical Exam Updated Vital Signs BP (!) 144/84   Pulse 83   Temp 98.8 F (37.1 C) (Oral)   Resp 18   Ht 5\' 5"  (1.651 m)   Wt 61.2 kg   LMP 05/13/2016 (Exact Date)   SpO2 97%   BMI 22.47 kg/m   Physical Exam Vitals and nursing note reviewed.  Constitutional:      General: She is not in acute distress.    Appearance: She is well-developed. She is not diaphoretic.  HENT:     Head: Normocephalic and atraumatic.  Cardiovascular:     Pulses: Normal pulses.  Pulmonary:     Effort: Pulmonary effort is normal.  Musculoskeletal:        General: Tenderness and signs of injury present. No swelling or deformity.     Comments: Mild tenderness ecchymosis to distal left thumb and distal left fifth finger with sensation intact, brisk capillary refill present, normal range of motion. Right wrist with minor abrasion to dorsal hand with mild tenderness, no snuffbox tenderness, no tenderness to distal radius or ulna.  Sensation intact, full range of motion without pain, brisk capillary refill present in each finger. Mild tenderness with ecchymosis to mid thigh laterally on right leg, states that she had a flashlight in her pocket and fell on this, believes this is a  source of her pain.  No pain with logroll of right hip.  Skin:    General: Skin is warm and dry.  Neurological:     Mental Status: She is alert and oriented to person, place, and time.     Sensory: No sensory deficit.  Psychiatric:        Behavior: Behavior normal.     ED Results / Procedures / Treatments   Labs (all labs ordered are listed, but only abnormal results are displayed) Labs Reviewed - No data to display  EKG None  Radiology DG Wrist Complete Right  Result Date: 03/25/2021 CLINICAL DATA:  50 year old female status post trip  and fall last night with continued pain. EXAM: RIGHT WRIST - COMPLETE 3+ VIEW COMPARISON:  Right hand series the same day. FINDINGS: There is no evidence of fracture or dislocation. There is no evidence of arthropathy or other focal bone abnormality. Soft tissues are unremarkable. IMPRESSION: Negative. Electronically Signed   By: Odessa Fleming M.D.   On: 03/25/2021 09:54   DG Hand Complete Left  Result Date: 03/25/2021 CLINICAL DATA:  50 year old female status post trip and fall last night with continued pain. EXAM: LEFT HAND - COMPLETE 3+ VIEW COMPARISON:  None. FINDINGS: There is no evidence of fracture or dislocation. There is no evidence of arthropathy or other focal bone abnormality. Soft tissues are unremarkable. IMPRESSION: Negative. Electronically Signed   By: Odessa Fleming M.D.   On: 03/25/2021 09:54   DG Femur Min 2 Views Right  Result Date: 03/25/2021 CLINICAL DATA:  50 year old female status post trip and fall last night with continued pain. EXAM: RIGHT FEMUR 2 VIEWS COMPARISON:  CT Abdomen and Pelvis 08/09/2010. FINDINGS: Right femoral head normally located. Proximal right femur intact. Visible right hemipelvis appears intact. Right femoral shaft intact. Bone mineralization is within normal limits. No acute osseous abnormality identified. No soft tissue gas.  View no right knee joint effusion. But on the cross-table lateral there is around 5.1 cm soft tissue density projecting in the medial distal thigh soft tissues of unclear etiology and significance. IMPRESSION: 1. No acute fracture or dislocation identified about the right femur. 2. Indeterminate 5.1 cm round soft tissue density/mass projecting in the medial distal thigh. Query pain or palpable abnormality at that site. Focused ultrasound may be the best initial modality for further evaluation if posttraumatic hematoma is suspected. Electronically Signed   By: Odessa Fleming M.D.   On: 03/25/2021 09:57    Procedures Procedures   Medications Ordered in  ED Medications  ibuprofen (ADVIL) tablet 800 mg (800 mg Oral Given 03/25/21 1007)    ED Course  I have reviewed the triage vital signs and the nursing notes.  Pertinent labs & imaging results that were available during my care of the patient were reviewed by me and considered in my medical decision making (see chart for details).  Clinical Course as of 03/25/21 1054  Sat Mar 25, 2021  2758 50 year old female presents after mechanical fall as above with complaint of pain in her right wrist, left fingers and right thigh. Exam as above, x-rays obtained to evaluate for bony injury.  X-ray left hand and right wrist unremarkable, right thigh x-ray with evidence of hematoma on exam. Offered right wrist splint, patient declines.  Plan is to apply ice to areas for 20 minutes at a time can take Motrin and Tylenol and recheck with PCP if pain is not improving after 1 week. [LM]    Clinical Course User Index [  LM] Alden Hipp   MDM Rules/Calculators/A&P                          Final Clinical Impression(s) / ED Diagnoses Final diagnoses:  Fall, initial encounter  Hematoma of right lower extremity, initial encounter  Contusion of right hand, initial encounter  Contusion of finger without damage to nail, unspecified finger, unspecified laterality, initial encounter    Rx / DC Orders ED Discharge Orders    None       Alden Hipp 03/25/21 1054    Koleen Distance, MD 03/25/21 [phone]

## 2021-03-25 NOTE — ED Triage Notes (Signed)
Patient reports she tripped and fell on concrete at work last night., today her right hand and arm is hurting, and left fingers hurt along with right hip. Patient states pain is 6/10.

## 2022-03-06 ENCOUNTER — Emergency Department (HOSPITAL_COMMUNITY): Payer: Self-pay

## 2022-03-06 ENCOUNTER — Encounter (HOSPITAL_COMMUNITY): Payer: Self-pay | Admitting: Emergency Medicine

## 2022-03-06 ENCOUNTER — Emergency Department (HOSPITAL_COMMUNITY)
Admission: EM | Admit: 2022-03-06 | Discharge: 2022-03-06 | Disposition: A | Discharge status: Home / Self Care | Payer: Self-pay | Attending: Emergency Medicine | Admitting: Emergency Medicine

## 2022-03-06 DIAGNOSIS — S63256A Unspecified dislocation of right little finger, initial encounter: Secondary | ICD-10-CM | POA: Insufficient documentation

## 2022-03-06 DIAGNOSIS — W548XXA Other contact with dog, initial encounter: Secondary | ICD-10-CM | POA: Insufficient documentation

## 2022-03-06 NOTE — ED Triage Notes (Signed)
Per pt, states she was holding her dog on a leash when another dog walked by suddenly yanking her right wrist-swelling and possible deformity ?

## 2022-03-06 NOTE — Discharge Instructions (Addendum)
We have reduced your right little finger on today's visit.  You were placed on a splint for protection as you might redislocated his finger. ? ?We will need to keep the splint in place for the next few days.  You may alternate ibuprofen and Tylenol to help with your symptoms. ?

## 2022-03-06 NOTE — ED Provider Notes (Signed)
?[facility]-EMERGENCY DEPT ?Provider Note ? ? ?CSN: [id number] ?Arrival date & time: 03/06/22  1625 ? ?  ? ?History ? ?Chief Complaint  ?Patient presents with  ? Hand Injury  ? ? ?Michele Vazquez is a 51 y.o. female. ? ?51 y.o female with no PMH presents to the ED with a chief complaint of right pinky pain status post injury.  Patient was ambulating her dog when they suddenly yanked her right wrist, there is a deformity to her right pinky finger noted on my exam.  She did take some Tylenol, without any improvement in symptoms. She has worsening symptoms, decrease in range of motion of the right pinky.  No other injury reported. ? ?The history is provided by the patient and medical records.  ?Hand Injury ?Location:  Finger ?Finger location:  R little finger ?Injury: yes   ?Time since incident:  2 hours ?Handedness:  Right-handed ?Dislocation: yes   ?Tetanus status:  Unknown ?Prior injury to area:  Yes ?Associated symptoms: no fever   ? ?  ? ?Home Medications ?Prior to Admission medications   ?Medication Sig Start Date End Date Taking? Authorizing Provider  ?naproxen sodium (ANAPROX) 220 MG tablet Take 440 mg by mouth 2 (two) times daily as needed (pain).    [provider]  ?   ? ?Allergies    ?Patient has no known allergies.   ? ?Review of Systems   ?Review of Systems  ?Constitutional:  Negative for chills and fever.  ?Musculoskeletal:  Positive for arthralgias.  ?All other systems reviewed and are negative. ? ?Physical Exam ?Updated Vital Signs ?BP (!) 135/92 (BP Location: Left Arm)   Pulse (!) 122   Temp 98 ?F (36.7 ?C) (Oral)   Resp 18   LMP 05/13/2016 (Exact Date)   SpO2 95%  ?Physical Exam ?Vitals and nursing note reviewed.  ?Constitutional:   ?   Appearance: Normal appearance.  ?HENT:  ?   Head: Normocephalic and atraumatic.  ?Cardiovascular:  ?   Rate and Rhythm: Normal rate.  ?Pulmonary:  ?   Effort: Pulmonary effort is normal.  ?Abdominal:  ?   General: Abdomen is flat.   ?Musculoskeletal:     ?   General: Deformity present.  ?   Right hand: Deformity and tenderness present.  ?   Cervical back: Normal range of motion and neck supple.  ?   Comments: Deformity of the right little finger noted. Decrease in ROM. Pulses present.   ?Skin: ?   General: Skin is warm and dry.  ?Neurological:  ?   Mental Status: She is alert and oriented to person, place, and time.  ? ? ?ED Results / Procedures / Treatments   ?Labs ?(all labs ordered are listed, but only abnormal results are displayed) ?Labs Reviewed - No data to display ? ?EKG ?None ? ?Radiology ?DG Finger Little Right ? ?Result Date: 03/06/2022 ?CLINICAL DATA:  Relocation, right pinky finger pain EXAM: RIGHT LITTLE FINGER 2+V COMPARISON:  None. FINDINGS: There is mild soft tissue swelling of the small finger. There is a probable nondisplaced fracture at the base of the distal phalanx with oblique longitudinal extension. Alignment is normal. IMPRESSION: Probable nondisplaced fracture at the base of the fifth digit distal phalanx with oblique longitudinal extension. Normal interphalangeal joint alignment. Mild soft tissue swelling. Electronically Signed   By: Caprice Renshaw M.D.   On: 03/06/2022 17:41   ? ?Procedures ?Reduction of dislocation ? ?Date/Time: 03/06/2022 5:30 PM ?Performed by: Claude Manges, PA-C ?  Authorized by: Claude Manges, PA-C  ?Consent: Verbal consent obtained. ?Patient identity confirmed: verbally with patient ?Local anesthesia used: no ? ?Anesthesia: ?Local anesthesia used: no ? ?Sedation: ?Patient sedated: no ? ?Patient tolerance: patient tolerated the procedure well with no immediate complications ?Comments: Reduction of dislocated right little finger at the DIP.  ? ?  ? ?Medications Ordered in ED ?Medications - No data to display ? ?ED Course/ Medical Decision Making/ A&P ?  ?                        ?Medical Decision Making ?Amount and/or Complexity of Data Reviewed ?Radiology: ordered. ? ? ?Patient presents to the ED status  post right hand injury after walking her dog and its running with the leash wrapped around her right wrist.  Obvious deformity noted around the little finger noted at the DIP joint.  I discussed sedation along with lidocaine injection in order to help with analgesia however patient would like to have this done without any type of pain management.  She did take Tylenol prior to arrival in ED. ? ?I was successfully able to reduce her right little finger at the bedside.  Will obtain x-ray for confirmation.  She will also be placed in a splint in order to help with pain and discomfort.  We discussed RICE therapy, orthopedic follow-up if needed. ? ?Xray of the right little finger: ? ?Probable nondisplaced fracture at the base of the fifth digit distal  ?phalanx with oblique longitudinal extension. Normal interphalangeal  ?joint alignment. Mild soft tissue swelling.  ? ?Patient placed on a static finger splint.  We discussed small fracture on the right little finger.  She is aware she will need to follow-up with orthopedics, given the referral for Dr. Scarlette Ar.  Patient is and agrees to management, patient stable for discharge. ? ? ? ?Portions of this note were generated with Scientist, clinical (histocompatibility and immunogenetics). Dictation errors may occur despite best attempts at proofreading.   ?Final Clinical Impression(s) / ED Diagnoses ?Final diagnoses:  ?Closed dislocation of right little finger  ? ? ?Rx / DC Orders ?ED Discharge Orders   ? ? None  ? ?  ? ? ?  ?Claude Manges, PA-C ?03/06/22 1747 ? ?  ?Terrilee Files, MD ?03/07/22 1408 ? ?

## 2022-03-29 DIAGNOSIS — M79644 Pain in right finger(s): Secondary | ICD-10-CM | POA: Insufficient documentation

## 2022-03-29 DIAGNOSIS — S63289A Dislocation of proximal interphalangeal joint of unspecified finger, initial encounter: Secondary | ICD-10-CM | POA: Insufficient documentation

## 2022-10-22 IMAGING — CR DG WRIST COMPLETE 3+V*R*
4 series · 4 of 4 positions shown · non-contrast
Comparison: Right hand series the same day.

CLINICAL DATA: 50-year-old female status post trip and fall last
night with continued pain.

EXAM:
RIGHT WRIST - COMPLETE 3+ VIEW

[x wrist pa right]
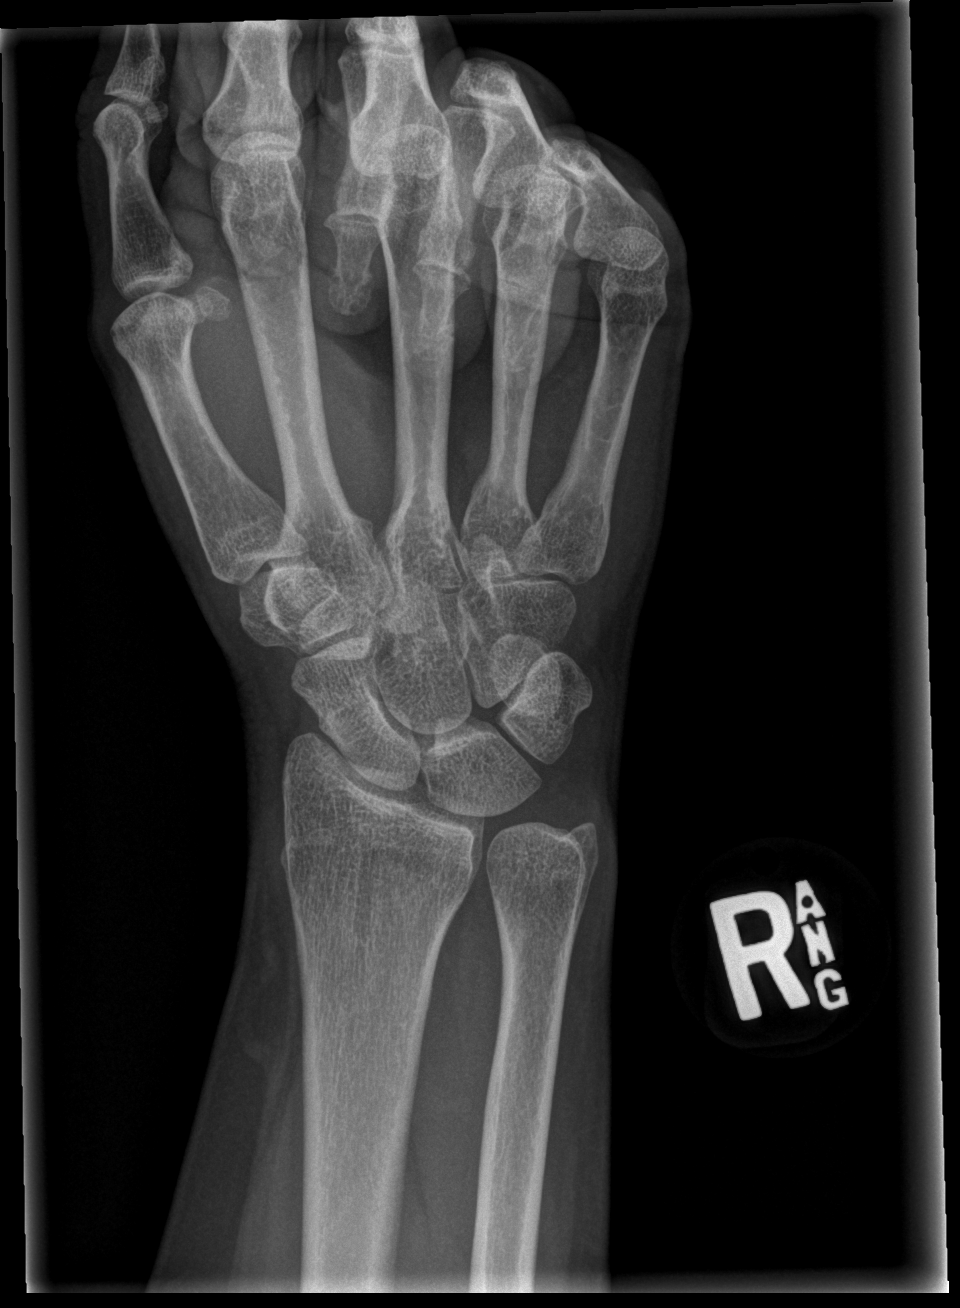

[x wrist obl right]
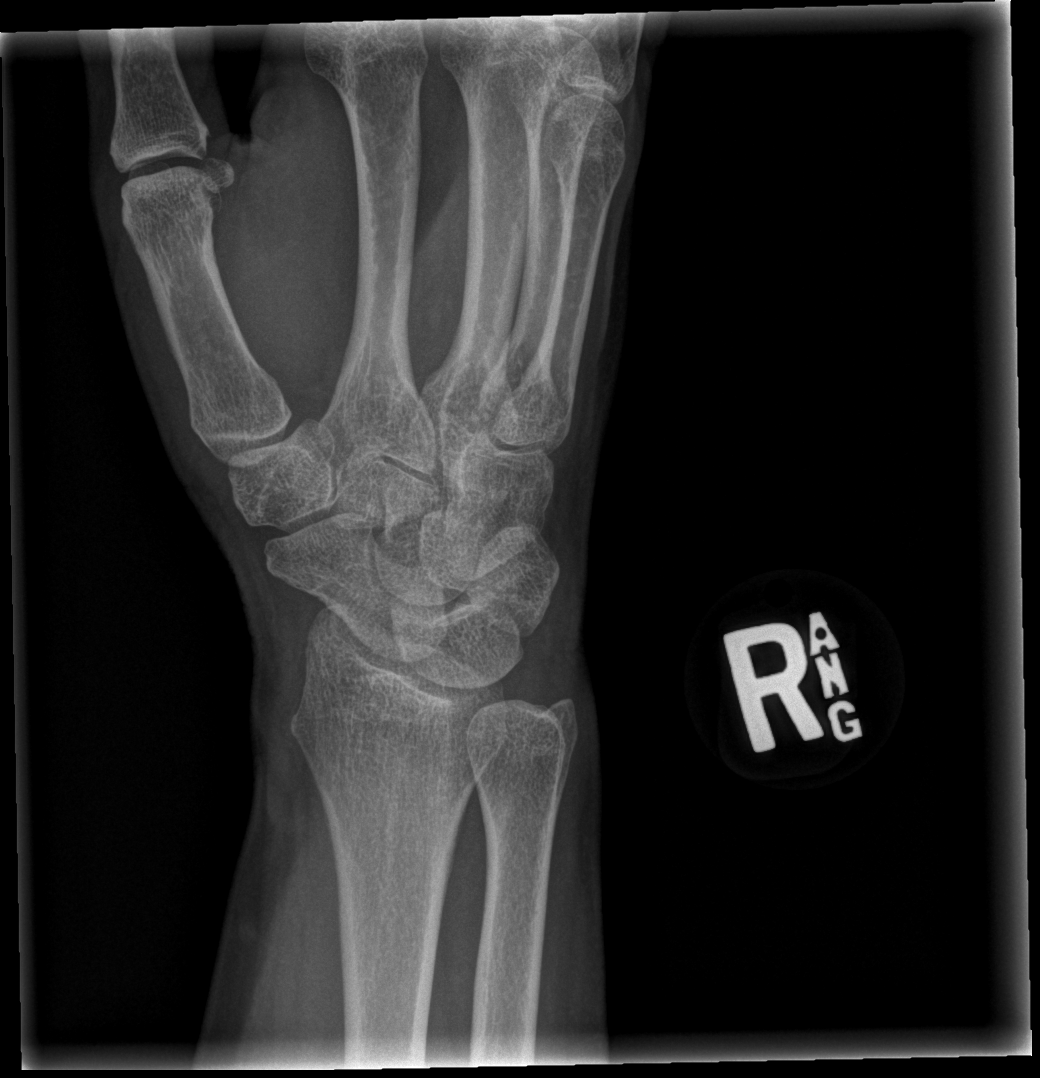

[x wrist lat right]
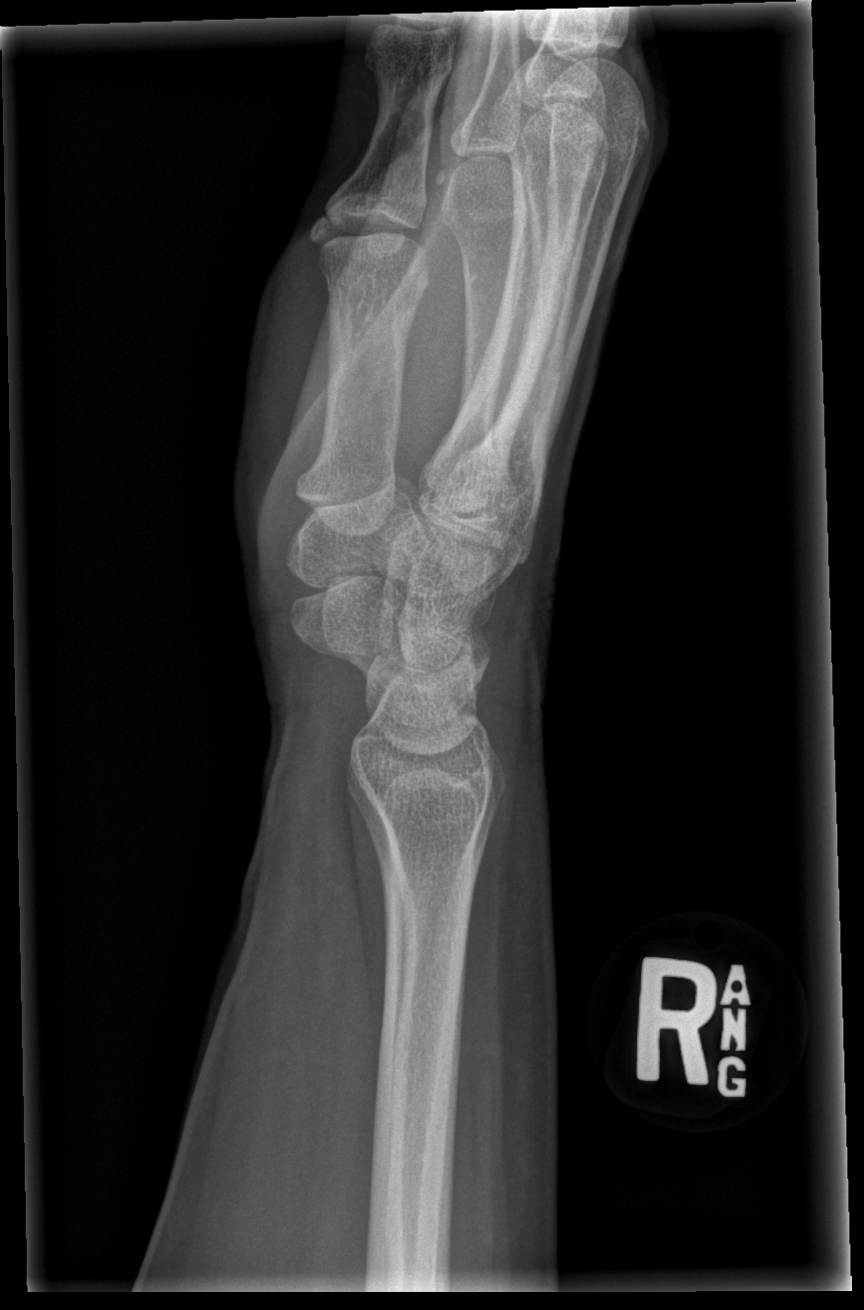

[x wrist navicular view right]
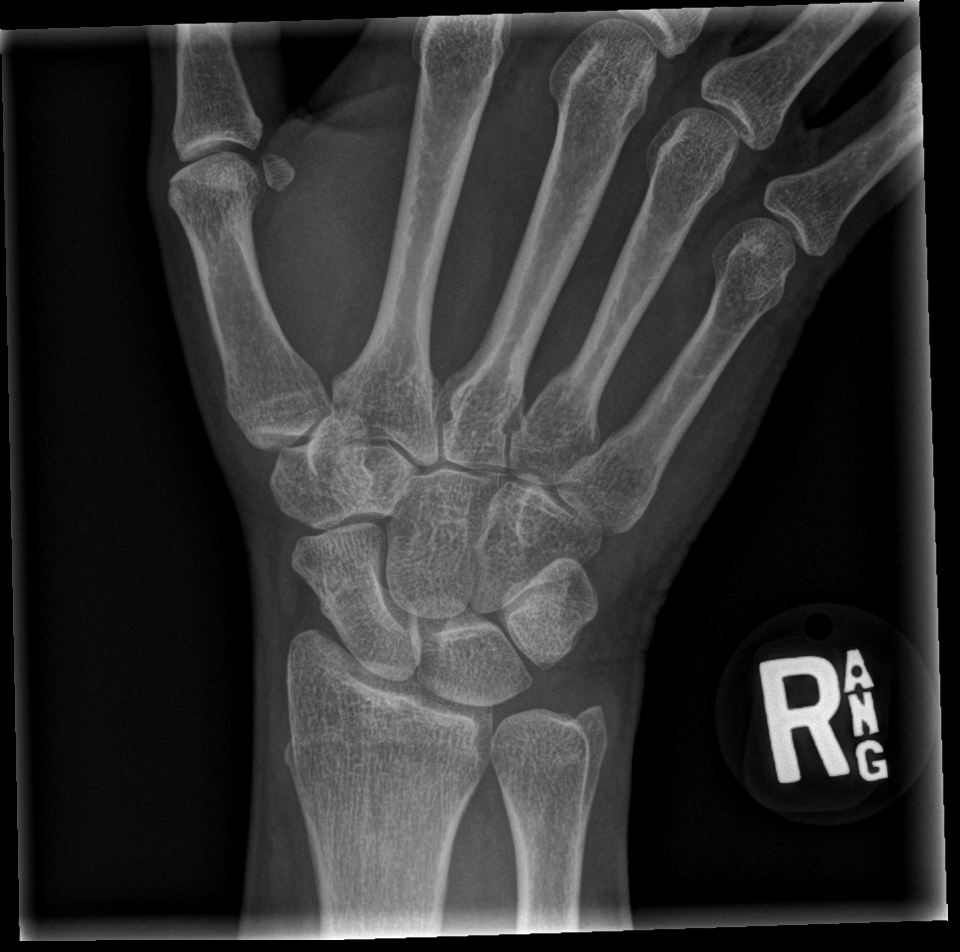

[4 of 4 positions shown; findings below may reference images not displayed]

FINDINGS: There is no evidence of fracture or dislocation. There is no
evidence of arthropathy or other focal bone abnormality. Soft
tissues are unremarkable.
IMPRESSION: Negative.

## 2022-11-04 ENCOUNTER — Other Ambulatory Visit: Payer: Self-pay

## 2022-11-04 ENCOUNTER — Encounter (HOSPITAL_COMMUNITY): Payer: Self-pay | Admitting: Emergency Medicine

## 2022-11-04 ENCOUNTER — Emergency Department (HOSPITAL_COMMUNITY)
Admission: EM | Admit: 2022-11-04 | Discharge: 2022-11-04 | Disposition: A | Discharge status: Home / Self Care | Payer: Commercial Managed Care - HMO | Attending: Emergency Medicine | Admitting: Emergency Medicine

## 2022-11-04 ENCOUNTER — Emergency Department (HOSPITAL_COMMUNITY): Payer: Commercial Managed Care - HMO

## 2022-11-04 DIAGNOSIS — M7521 Bicipital tendinitis, right shoulder: Secondary | ICD-10-CM | POA: Diagnosis not present

## 2022-11-04 DIAGNOSIS — Y93K1 Activity, walking an animal: Secondary | ICD-10-CM | POA: Insufficient documentation

## 2022-11-04 DIAGNOSIS — S40011A Contusion of right shoulder, initial encounter: Secondary | ICD-10-CM | POA: Insufficient documentation

## 2022-11-04 DIAGNOSIS — M25511 Pain in right shoulder: Secondary | ICD-10-CM | POA: Diagnosis present

## 2022-11-04 DIAGNOSIS — M6790 Unspecified disorder of synovium and tendon, unspecified site: Secondary | ICD-10-CM

## 2022-11-04 DIAGNOSIS — W010XXA Fall on same level from slipping, tripping and stumbling without subsequent striking against object, initial encounter: Secondary | ICD-10-CM | POA: Insufficient documentation

## 2022-11-04 MED ORDER — IBUPROFEN 600 MG PO TABS: 600.0000 mg | ORAL_TABLET | Freq: Four times a day (QID) | ORAL | 0 refills | Status: DC

## 2022-11-04 MED ORDER — NAPROXEN 500 MG PO TABS
500.0000 mg | ORAL_TABLET | Freq: Once | ORAL | Status: AC
  Administered 2022-11-04: 500 mg via ORAL
  Filled 2022-11-04: qty 1

## 2022-11-04 NOTE — Discharge Instructions (Signed)
The Xray of your shoulder is normal. Please take the medicine prescribed for pain control. Please call the Orthopedic doctor for a follow up in 2 weeks. Ice the shoulder 4 times a day.  No lifting more than 5 lbs.

## 2022-11-04 NOTE — ED Triage Notes (Signed)
Patient was taking her dog out this morning, she jumped and ran, she fell and her right arm slid and pop. Right arm pain from elbow to shoulder. Took aspirin this morning.

## 2022-11-04 NOTE — ED Provider Notes (Signed)
[facility]-EMERGENC[facility] DEPT Provider Note   CSN: [id number] Arrival date & time: 11/04/22  1351     History  No chief complaint on file.   Michele Vazquez is a 51 y.o. female.  HPI    Pt comes in w/ cc of shoulder pain. Patient states that she had a mechanical fall this morning while walking her dog.  The dog got excited, started running and pulled her.  Patient had a FOOSH mechanism injury and had pain yesterday to her right shoulder.  She also heard a pop.  Initially the pain was tolerable, but over time the pain has gotten worse.  Patient has no medical problems.  She does not take any blood thinners.  She denies any head trauma, headache, neck pain, focal weakness or numbness, nausea, vomiting, vision change.  She denies any chest pain, shortness of breath.  She has ambulated since the accident without any issues.   Home Medications Prior to Admission medications   Medication Sig Start Date End Date Taking? Authorizing Provider  ibuprofen (ADVIL) 600 MG tablet Take 1 tablet (600 mg total) by mouth 4 (four) times daily. 11/04/22  Yes Jereme Loren, Janey Genta, MD  naproxen sodium (ANAPROX) 220 MG tablet Take 440 mg by mouth 2 (two) times daily as needed (pain).    [provider]      Allergies    Patient has no known allergies.    Review of Systems   Review of Systems  Physical Exam Updated Vital Signs BP 125/77 (BP Location: Left Arm)   Pulse 88   Temp 98.2 F (36.8 C) (Oral)   Resp 16   Ht 5\' 5"  (1.651 m)   Wt 61.2 kg   LMP 05/13/2016 (Exact Date)   SpO2 93%   BMI 22.47 kg/m  Physical Exam Vitals and nursing note reviewed.  Constitutional:      Appearance: She is well-developed.  HENT:     Head: Atraumatic.  Cardiovascular:     Rate and Rhythm: Normal rate.  Pulmonary:     Effort: Pulmonary effort is normal.  Musculoskeletal:        General: Swelling and tenderness present.     Cervical back: Normal range of motion and neck  supple.     Comments: Patient has reproducible tenderness with palpation of the right shoulder anteriorly.  She also has tenderness with abduction, forward flexion and internal rotation of the shoulder.  Skin:    General: Skin is warm and dry.  Neurological:     Mental Status: She is alert and oriented to person, place, and time.     ED Results / Procedures / Treatments   Labs (all labs ordered are listed, but only abnormal results are displayed) Labs Reviewed - No data to display  EKG None  Radiology DG Shoulder Right  Result Date: 11/04/2022 CLINICAL DATA:  11/06/2022. Right shoulder pain. EXAM: RIGHT SHOULDER - 2+ VIEW COMPARISON:  None Available. FINDINGS: The glenohumeral and AC joints are intact. No acute fracture is identified. The visualized right ribs are intact and the visualized right lung is clear. IMPRESSION: No acute bony findings. Electronically Signed   By: Larey Seat M.D.   On: 11/04/2022 15:01   DG Humerus Right  Result Date: 11/04/2022 CLINICAL DATA:  Right arm injury. EXAM: RIGHT HUMERUS - 2+ VIEW COMPARISON:  None Available. FINDINGS: The shoulder and elbow joints are maintained. No acute fracture of the humerus is identified. IMPRESSION: No acute bony findings. Electronically Signed  By: Rudie Meyer M.D.   On: 11/04/2022 15:01    Procedures Procedures    Medications Ordered in ED Medications  naproxen (NAPROSYN) tablet 500 mg (500 mg Oral Given 11/04/22 1500)    ED Course/ Medical Decision Making/ A&P Clinical Course as of 11/04/22 1538  Sun Nov 04, 2022  1538 DG Shoulder Right X-ray of the shoulder independently interpreted.  There is no evidence of fracture.  Results discussed with the patient.  RICE treatment advocated.  Orthopedic follow-up information provided, as patient does not have PCP. [AN]    Clinical Course User Index [AN] Derwood Kaplan, MD                           Medical Decision Making Amount and/or Complexity of Data  Reviewed Radiology: ordered.  Risk Prescription drug management.   51 year old patient comes in with chief complaint of mechanical fall.  Differential diagnosis considered includes shoulder dislocation, AC separation, humeral fracture, clavicular fracture.  On exam patient primarily has tenderness over her right shoulder and with abduction, forward flexion and internal rotation.  No tenderness over the clavicle.  X-ray of the shoulder were ordered, interpreted independently.  X-ray does not show any evidence of fracture of the humerus.    Final Clinical Impression(s) / ED Diagnoses Final diagnoses:  Contusion of right shoulder, initial encounter  Tendinopathy    Rx / DC Orders ED Discharge Orders          Ordered    ibuprofen (ADVIL) 600 MG tablet  4 times daily        11/04/22 1452              Derwood Kaplan, MD 11/04/22 1539

## 2023-01-15 NOTE — Progress Notes (Signed)
Subjective:    Michele Vazquez - 52 y.o. female MRN [id number]  Date of birth: 1971-05-17  HPI  Michele Vazquez is to establish care.   Current issues and/or concerns: Reports right shoulder pain began 3 months ago suddenly while walking her dog. She is established with [facility] for management of the same. She denies any issues/concerns for discussion today.    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 She uses smokeless tobacco.   Family History  family history is not on file.   Medications: reviewed and updated   Objective:   Physical Exam BP 120/75 (BP Location: Left Arm, Patient Position: Sitting, Cuff Size: Normal)   Pulse 94   Temp 98.3 F (36.8 C) (Oral)   Resp 16   Ht 5' 5"$  (1.651 m)   Wt 146 lb 9.6 oz (66.5 kg)   LMP 05/13/2016 (Exact Date)   SpO2 93%   BMI 24.40 kg/m   Physical Exam HENT:     Head: Normocephalic and atraumatic.  Eyes:     Extraocular Movements: Extraocular movements intact.     Conjunctiva/sclera: Conjunctivae normal.     Pupils: Pupils are equal, round, and reactive to light.  Cardiovascular:     Rate and Rhythm: Normal rate and regular rhythm.     Pulses: Normal pulses.     Heart sounds: Normal heart sounds.  Pulmonary:     Effort: Pulmonary effort is normal.     Breath sounds: Normal breath sounds.  Musculoskeletal:      Right shoulder: Normal.     Left shoulder: Normal.     Right upper arm: Normal.     Left upper arm: Normal.     Right elbow: Normal.     Left elbow: Normal.     Right forearm: Normal.     Left forearm: Normal.     Right wrist: Normal.     Left wrist: Normal.     Right hand: Normal.     Left hand: Normal.     Cervical back: Normal range of motion and neck supple.  Neurological:     General: No focal deficit present.     Mental Status: She is alert and oriented to person, place, and time.  Psychiatric:        Mood and Affect: Mood normal.        Behavior: Behavior normal.       Assessment & Plan:  1. Encounter to establish care - Patient presents today to establish care.  - Return for annual physical examination, labs, and health maintenance. Arrive fasting meaning having no food for at least 8 hours prior to appointment. You may have only water or black coffee. Please take scheduled medications as normal.  2. Chronic right shoulder pain - Keep all scheduled appointments with [facility].  3. Need for Tdap vaccination - Administered.  - Tdap vaccine greater than or equal to 7yo IM   Patient was given clear instructions to go to Emergency Department or return to medical center if symptoms don't improve, worsen, or new problems develop.The patient verbalized understanding.  I discussed the assessment and treatment plan with the patient. The patient was provided an opportunity to ask questions and all were answered. The patient agreed with the plan and demonstrated an understanding of the instructions.   The patient was advised to call back or seek an in-person evaluation if the symptoms worsen or if the condition fails to improve as anticipated.    Durene Fruits, NP 01/21/2023, 8:23 AM Primary Care at [facility]

## 2023-01-21 ENCOUNTER — Encounter: Payer: Self-pay | Admitting: Family

## 2023-01-21 ENCOUNTER — Ambulatory Visit: Payer: Commercial Managed Care - HMO | Admitting: Family

## 2023-01-21 VITALS — BP 120/75 | HR 94 | Temp 98.3°F | Resp 16 | Ht 65.0 in | Wt 146.6 lb

## 2023-01-21 DIAGNOSIS — G8929 Other chronic pain: Secondary | ICD-10-CM

## 2023-01-21 DIAGNOSIS — Z7689 Persons encountering health services in other specified circumstances: Secondary | ICD-10-CM | POA: Diagnosis not present

## 2023-01-21 DIAGNOSIS — Z23 Encounter for immunization: Secondary | ICD-10-CM | POA: Diagnosis not present

## 2023-01-21 DIAGNOSIS — M25511 Pain in right shoulder: Secondary | ICD-10-CM

## 2023-01-21 NOTE — Patient Instructions (Signed)
Thank you for choosing [facility] at [facility] for your medical home!    Michele Vazquez was seen by Camillia Herter, NP today.   Michele Vazquez's primary care provider is Michele Fruits, NP.   For the best care possible,  you should try to see Michele Fruits, NP whenever you come to office.   We look forward to seeing you again soon!  If you have any questions about your visit today,  please call us at [phone]  Or feel free to reach your provider via [place].    Keeping you healthy   Get these tests Blood pressure- Have your blood pressure checked once a year by your healthcare provider.  Normal blood pressure is 120/80. Weight- Have your body mass index (BMI) calculated to screen for obesity.  BMI is a measure of body fat based on height and weight. You can also calculate your own BMI at [employer]velBags.it. Cholesterol- Have your cholesterol checked regularly starting at age 82, sooner may be necessary if you have diabetes, high blood pressure, if a family member developed heart diseases at an early age or if you smoke.  Chlamydia, HIV, and other sexual transmitted disease- Get screened each year until the age of 66 then within three months of each new sexual partner. Diabetes- Have your blood sugar checked regularly if you have high blood pressure, high cholesterol, a family history of diabetes or if you are overweight.   Get these vaccines Flu shot- Every fall. Tetanus shot- Every 10 years. Menactra- Single dose; prevents meningitis.   Take these steps Don't smoke- If you do smoke, ask your healthcare provider about quitting. For tips on how to quit, go to www.smokefree.gov or call 1-800-QUIT-NOW. Be physically active- Exercise 5 days a week for at least 30 minutes.  If you are not already physically active start slow and gradually work up to 30 minutes of moderate physical activity.  Examples of moderate activity include walking briskly, mowing the yard, dancing,  swimming bicycling, etc. Eat a healthy diet- Eat a variety of healthy foods such as Vazquez, vegetables, low fat milk, low fat cheese, yogurt, lean meats, poultry, fish, beans, tofu, etc.  For more information on healthy eating, go to www.thenutritionsource.org Drink alcohol in moderation- Limit alcohol intake two drinks or less a day.  Never drink and drive. Dentist- Brush and floss teeth twice daily; visit your dentis twice a year. Depression-Your emotional health is as important as your physical health.  If you're feeling down, losing interest in things you normally enjoy please talk with your healthcare provider. Gun Safety- If you keep a gun in your home, keep it unloaded and with the safety lock on.  Bullets should be stored separately. Helmet use- Always wear a helmet when riding a motorcycle, bicycle, rollerblading or skateboarding. Safe sex- If you may be exposed to a sexually transmitted infection, use a condom Seat belts- Seat bels can save your life; always wear one. Smoke/Carbon Monoxide detectors- These detectors need to be installed on the appropriate level of your home.  Replace batteries at least once a year. Skin Cancer- When out in the sun, cover up and use sunscreen SPF 15 or higher. Violence- If anyone is threatening or hurting you, please tell your healthcare provider.

## 2023-03-13 NOTE — Progress Notes (Signed)
Patient ID: Michele Vazquez, female    DOB: 07/31/1971  MRN: [id number]  CC: Annual Physical Exam  Subjective: Michele Vazquez is a 52 y.o. female who presents for annual physical exam.   Her concerns today include:  - Lump right upper thigh for several years. Since then progressively getting larger. She is unsure how the same began. She denies recent trauma/injury and red flag symptoms. No discomfort unless she applies direct pressure to the site.   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     No current facility-administered medications on file prior to visit.    No Known Allergies  Social History   Socioeconomic History   Marital status: Divorced    Spouse name: Not on file   Number of children: Not on file   Years of education: Not on file   Highest education level: Not on file  Occupational History   Not on file  Tobacco Use   Smoking status: Every Day   Smokeless tobacco: Current  Substance and Sexual Activity   Alcohol use: Not on file   Drug use: Not on file   Sexual activity: Not on file  Other Topics Concern   Not on file  Social History Narrative   Not on file   Social Determinants of Health   Financial Resource Strain: Not on file  Food Insecurity: Not on file  Transportation Needs: Not on file  Physical Activity: Not on file  Stress: Not on file  Social Connections: Not on file  Intimate Partner Violence: Not on file    No family history on file.  Past Surgical History:  Procedure  Laterality Date   EYE SURGERY      ROS: Review of Systems Negative except as stated above  PHYSICAL EXAM: BP 113/77 (BP Location: Right Arm, Patient Position: Sitting, Cuff Size: Normal)   Pulse 99   Temp 98.6 F (37 C)   Resp 16   Ht  (1.651 m)   Wt 146 lb 6.4 oz (66.4 kg)   LMP 05/13/2016 (Exact Date)   SpO2 95%   BMI 24.36 kg/m   Physical Exam HENT:     Head: Normocephalic and atraumatic.     Right Ear: Tympanic membrane, ear canal and external ear normal.     Left Ear: Tympanic membrane, ear canal and external ear normal.     Nose: Nose normal.     Mouth/Throat:     Mouth: Mucous membranes are moist.     Pharynx: Oropharynx is clear.  Eyes:     Extraocular Movements: Extraocular movements intact.     Conjunctiva/sclera: Conjunctivae normal.     Pupils: Pupils are equal, round, and reactive to light.  Cardiovascular:     Rate and Rhythm: Normal rate and regular rhythm.     Pulses: Normal pulses.     Heart sounds: Normal heart sounds.  Pulmonary:     Effort: Pulmonary effort is normal.     Breath sounds: Normal breath sounds.  Chest:  Comments: Patient declined.  Abdominal:     General: Bowel sounds are normal.     Palpations: Abdomen is soft.  Genitourinary:    Comments: Patient declined.  Musculoskeletal:        General: Normal range of motion.     Right shoulder: Normal.     Left shoulder: Normal.     Right upper arm: Normal.     Left upper arm: Normal.     Right elbow: Normal.     Left elbow: Normal.     Right forearm: Normal.     Left forearm: Normal.     Right wrist: Normal.     Left wrist: Normal.     Right hand: Normal.     Left hand: Normal.     Cervical back: Normal, normal range of motion and neck supple.     Thoracic back: Normal.     Lumbar back: Normal.     Right hip: Normal.     Left hip: Normal.     Left upper leg: Normal.     Right knee: Normal.     Left knee: Normal.     Right lower leg: Normal.     Left lower leg:  Normal.     Right ankle: Normal.     Left ankle: Normal.     Right foot: Normal.     Left foot: Normal.     Comments: Right upper extremity movable firm nodule located at thigh. No additional presentation.    Skin:    General: Skin is warm and dry.     Capillary Refill: Capillary refill takes less than 2 seconds.  Neurological:     General: No focal deficit present.     Mental Status: She is alert and oriented to person, place, and time.  Psychiatric:        Mood and Affect: Mood normal.        Behavior: Behavior normal.      ASSESSMENT AND PLAN: 1. Annual physical exam - Counseled on 150 minutes of exercise per week as tolerated, healthy eating (including decreased daily intake of saturated fats, cholesterol, added sugars, sodium), STI prevention, and routine healthcare maintenance.  2. Screening for metabolic disorder - Routine screening.  - CMP14+EGFR  3. Screening for deficiency anemia - Routine screening.  - CBC  4. Diabetes mellitus screening - Routine screening.  - Hemoglobin A1c  5. Screening cholesterol level - Routine screening.  - Lipid panel  6. Thyroid disorder screen - Routine screening.  - TSH  7. Encounter for screening mammogram for malignant neoplasm of breast - Routine screening.  - MM Digital Screening; Future  8. Pap smear for cervical cancer screening 9. Routine screening for STI (sexually transmitted infection) - Referral to Gynecology for further evaluation/management.  - Ambulatory referral to Gynecology  10. Colon cancer screening - Referral to Gastroenterology for further evaluation/management. - Ambulatory referral to Gastroenterology  11. Lump of skin of lower extremity, right - Referral to Dermatology for further evaluation/management.  - Ambulatory referral to Dermatology  Patient was given the opportunity to ask questions.  Patient verbalized understanding of the plan and was able to repeat key elements of the plan. Patient  was given clear instructions to go to Emergency Department or return to medical center if symptoms don't improve, worsen, or new problems develop.The patient verbalized understanding.   Orders Placed This Encounter  Procedures   MM Digital Screening   CBC   Lipid panel   TSH  CMP14+EGFR   Hemoglobin A1c   Ambulatory referral to Gastroenterology   Ambulatory referral to Gynecology   Ambulatory referral to Dermatology     Return in about 1 year (around 03/18/2024) for Physical per patient preference.  Rema Fendt, NP

## 2023-03-19 ENCOUNTER — Encounter: Payer: Self-pay | Admitting: Family

## 2023-03-19 ENCOUNTER — Ambulatory Visit (INDEPENDENT_AMBULATORY_CARE_PROVIDER_SITE_OTHER): Payer: Commercial Managed Care - HMO | Admitting: Family

## 2023-03-19 VITALS — BP 113/77 | HR 99 | Temp 98.6°F | Resp 16 | Ht 65.0 in | Wt 146.4 lb

## 2023-03-19 DIAGNOSIS — Z0001 Encounter for general adult medical examination with abnormal findings: Secondary | ICD-10-CM | POA: Diagnosis not present

## 2023-03-19 DIAGNOSIS — Z1231 Encounter for screening mammogram for malignant neoplasm of breast: Secondary | ICD-10-CM | POA: Diagnosis not present

## 2023-03-19 DIAGNOSIS — Z13220 Encounter for screening for lipoid disorders: Secondary | ICD-10-CM

## 2023-03-19 DIAGNOSIS — Z0000 Encounter for general adult medical examination without abnormal findings: Secondary | ICD-10-CM

## 2023-03-19 DIAGNOSIS — F17210 Nicotine dependence, cigarettes, uncomplicated: Secondary | ICD-10-CM | POA: Diagnosis not present

## 2023-03-19 DIAGNOSIS — Z1211 Encounter for screening for malignant neoplasm of colon: Secondary | ICD-10-CM

## 2023-03-19 DIAGNOSIS — Z13228 Encounter for screening for other metabolic disorders: Secondary | ICD-10-CM | POA: Diagnosis not present

## 2023-03-19 DIAGNOSIS — Z131 Encounter for screening for diabetes mellitus: Secondary | ICD-10-CM

## 2023-03-19 DIAGNOSIS — Z1329 Encounter for screening for other suspected endocrine disorder: Secondary | ICD-10-CM

## 2023-03-19 DIAGNOSIS — R2241 Localized swelling, mass and lump, right lower limb: Secondary | ICD-10-CM | POA: Diagnosis not present

## 2023-03-19 DIAGNOSIS — Z130 Encounter for screening for diseases of the blood and blood-forming organs and certain disorders involving the immune mechanism: Secondary | ICD-10-CM

## 2023-03-19 DIAGNOSIS — Z124 Encounter for screening for malignant neoplasm of cervix: Secondary | ICD-10-CM

## 2023-03-19 DIAGNOSIS — Z113 Encounter for screening for infections with a predominantly sexual mode of transmission: Secondary | ICD-10-CM

## 2023-03-19 NOTE — Progress Notes (Signed)
Pt is here for Physical   Complaining of lump under her skin on her right thigh for several years   Declined PAP

## 2023-03-19 NOTE — Patient Instructions (Signed)
Preventive Care 52 Years Old, Female Preventive care refers to lifestyle choices and visits with your health care provider that can promote health and wellness. Preventive care visits are also called wellness exams. What can I expect for my preventive care visit? Counseling Your health care provider may ask you questions about your: Medical history, including: Past medical problems. Family medical history. Pregnancy history. Current health, including: Menstrual cycle. Method of birth control. Emotional well-being. Home life and relationship well-being. Sexual activity and sexual health. Lifestyle, including: Alcohol, nicotine or tobacco, and drug use. Access to firearms. Diet, exercise, and sleep habits. Work and work environment. Sunscreen use. Safety issues such as seatbelt and bike helmet use. Physical exam Your health care provider will check your: Height and weight. These may be used to calculate your BMI (body mass index). BMI is a measurement that tells if you are at a healthy weight. Waist circumference. This measures the distance around your waistline. This measurement also tells if you are at a healthy weight and may help predict your risk of certain diseases, such as type 2 diabetes and high blood pressure. Heart rate and blood pressure. Body temperature. Skin for abnormal spots. What immunizations do I need?  Vaccines are usually given at various ages, according to a schedule. Your health care provider will recommend vaccines for you based on your age, medical history, and lifestyle or other factors, such as travel or where you work. What tests do I need? Screening Your health care provider may recommend screening tests for certain conditions. This may include: Lipid and cholesterol levels. Diabetes screening. This is done by checking your blood sugar (glucose) after you have not eaten for a while (fasting). Pelvic exam and Pap test. Hepatitis B test. Hepatitis C  test. HIV (human immunodeficiency virus) test. STI (sexually transmitted infection) testing, if you are at risk. Lung cancer screening. Colorectal cancer screening. Mammogram. Talk with your health care provider about when you should start having regular mammograms. This may depend on whether you have a family history of breast cancer. BRCA-related cancer screening. This may be done if you have a family history of breast, ovarian, tubal, or peritoneal cancers. Bone density scan. This is done to screen for osteoporosis. Talk with your health care provider about your test results, treatment options, and if necessary, the need for more tests. Follow these instructions at home: Eating and drinking  Eat a diet that includes fresh fruits and vegetables, whole grains, lean protein, and low-fat dairy products. Take vitamin and mineral supplements as recommended by your health care provider. Do not drink alcohol if: Your health care provider tells you not to drink. You are pregnant, may be pregnant, or are planning to become pregnant. If you drink alcohol: Limit how much you have to 0-1 drink a day. Know how much alcohol is in your drink. In the [place] one drink equals one 12 oz bottle of beer (355 mL), one 5 oz glass of wine (148 mL), or one 1 oz glass of hard liquor (44 mL). Lifestyle Brush your teeth every morning and night with fluoride toothpaste. Floss one time each day. Exercise for at least 30 minutes 5 or more days each week. Do not use any products that contain nicotine or tobacco. These products include cigarettes, chewing tobacco, and vaping devices, such as e-cigarettes. If you need help quitting, ask your health care provider. Do not use drugs. If you are sexually active, practice safe sex. Use a condom or other form of protection to   prevent STIs. If you do not wish to become pregnant, use a form of birth control. If you plan to become pregnant, see your health care provider for a  prepregnancy visit. Take aspirin only as told by your health care provider. Make sure that you understand how much to take and what form to take. Work with your health care provider to find out whether it is safe and beneficial for you to take aspirin daily. Find healthy ways to manage stress, such as: Meditation, yoga, or listening to music. Journaling. Talking to a trusted person. Spending time with friends and family. Minimize exposure to UV radiation to reduce your risk of skin cancer. Safety Always wear your seat belt while driving or riding in a vehicle. Do not drive: If you have been drinking alcohol. Do not ride with someone who has been drinking. When you are tired or distracted. While texting. If you have been using any mind-altering substances or drugs. Wear a helmet and other protective equipment during sports activities. If you have firearms in your house, make sure you follow all gun safety procedures. Seek help if you have been physically or sexually abused. What's next? Visit your health care provider once a year for an annual wellness visit. Ask your health care provider how often you should have your eyes and teeth checked. Stay up to date on all vaccines. This information is not intended to replace advice given to you by your health care provider. Make sure you discuss any questions you have with your health care provider. Document Revised: 05/24/2021 Document Reviewed: 05/24/2021 Elsevier Patient Education  2023 Elsevier Inc.  

## 2023-03-20 ENCOUNTER — Encounter: Payer: Self-pay | Admitting: Family

## 2023-03-20 DIAGNOSIS — R7303 Prediabetes: Secondary | ICD-10-CM | POA: Insufficient documentation

## 2023-03-20 LAB — LIPID PANEL
Chol/HDL Ratio: 3.1 ratio (ref 0.0–4.4)
Cholesterol, Total: 230 mg/dL — ABNORMAL HIGH (ref 100–199)
HDL: 74 mg/dL (ref >39)
LDL Chol Calc (NIH): 115 mg/dL — ABNORMAL HIGH (ref 0–99)
Triglycerides: 243 mg/dL — ABNORMAL HIGH (ref 0–149)
VLDL Cholesterol Cal: 41 mg/dL — ABNORMAL HIGH (ref 5–40)

## 2023-03-20 LAB — CMP14+EGFR
ALT: 24 IU/L (ref 0–32)
AST: 27 IU/L (ref 0–40)
Albumin/Globulin Ratio: 1.8 (ref 1.2–2.2)
Albumin: 4.6 g/dL (ref 3.8–4.9)
Alkaline Phosphatase: 94 IU/L (ref 44–121)
BUN/Creatinine Ratio: 11 (ref 9–23)
BUN: 8 mg/dL (ref 6–24)
Bilirubin Total: 0.6 mg/dL (ref 0.0–1.2)
CO2: 22 mmol/L (ref 20–29)
Calcium: 9.9 mg/dL (ref 8.7–10.2)
Chloride: 105 mmol/L (ref 96–106)
Creatinine, Ser: 0.74 mg/dL (ref 0.57–1.00)
Globulin, Total: 2.6 g/dL (ref 1.5–4.5)
Glucose: 108 mg/dL — ABNORMAL HIGH (ref 70–99)
Potassium: 4.2 mmol/L (ref 3.5–5.2)
Sodium: 141 mmol/L (ref 134–144)
Total Protein: 7.2 g/dL (ref 6.0–8.5)
eGFR: 97 mL/min/{1.73_m2} (ref >59)

## 2023-03-20 LAB — CBC
Hematocrit: 45.7 % (ref 34.0–46.6)
Hemoglobin: 15.9 g/dL (ref 11.1–15.9)
MCH: 34.5 pg — ABNORMAL HIGH (ref 26.6–33.0)
MCHC: 34.8 g/dL (ref 31.5–35.7)
MCV: 99 fL — ABNORMAL HIGH (ref 79–97)
Platelets: 255 10*3/uL (ref 150–450)
RBC: 4.61 x10E6/uL (ref 3.77–5.28)
RDW: 11.5 % — ABNORMAL LOW (ref 11.7–15.4)
WBC: 7.6 10*3/uL (ref 3.4–10.8)

## 2023-03-20 LAB — HEMOGLOBIN A1C
Est. average glucose Bld gHb Est-mCnc: 123 mg/dL
Hgb A1c MFr Bld: 5.9 % — ABNORMAL HIGH (ref 4.8–5.6)

## 2023-03-20 LAB — TSH: TSH: 1.53 u[IU]/mL (ref 0.450–4.500)

## 2023-04-22 ENCOUNTER — Ambulatory Visit
Admission: EM | Admit: 2023-04-22 | Discharge: 2023-04-22 | Disposition: A | Discharge status: Home / Self Care | Payer: Commercial Managed Care - HMO | Attending: Family Medicine | Admitting: Family Medicine

## 2023-04-22 DIAGNOSIS — S0031XA Abrasion of nose, initial encounter: Secondary | ICD-10-CM | POA: Diagnosis not present

## 2023-04-22 DIAGNOSIS — Z23 Encounter for immunization: Secondary | ICD-10-CM | POA: Diagnosis not present

## 2023-04-22 MED ORDER — MUPIROCIN 2 % EX OINT: 1.0000 | TOPICAL_OINTMENT | Freq: Two times a day (BID) | CUTANEOUS | 0 refills | Status: AC

## 2023-04-22 MED ORDER — TETANUS-DIPHTH-ACELL PERTUSSIS 5-2.5-18.5 LF-MCG/0.5 IM SUSY
0.5000 mL | PREFILLED_SYRINGE | Freq: Once | INTRAMUSCULAR | Status: AC
  Administered 2023-04-22: 0.5 mL via INTRAMUSCULAR

## 2023-04-22 NOTE — ED Triage Notes (Signed)
Pt presents with c/o laceration to the tip of the nose after falling in her yard today while mowing.   Pt controlled the bleeding.  Denies being on blood thinners.

## 2023-04-22 NOTE — Discharge Instructions (Signed)
Put mupirocin ointment on the sore areas twice daily until improved  You have been given a Tdap vaccination to boost your tetanus immunity  

## 2023-04-22 NOTE — ED Provider Notes (Signed)
[facility]    CSN: [id number] Arrival date & time: 04/22/23  1430      History   Chief Complaint Chief Complaint  Patient presents with   Facial Injury    HPI Michele Vazquez is a 52 y.o. female.    Facial Injury  Here for an abrasion on the tip of her nose.  Today she was mowing when she had an object that flew up and hit her in the tip of her nose.  It bled a good bit until she put some "second skin" on it.  Did not hurt her anywhere else and she did not have any loss of consciousness.  She has no allergy medication  We discussed her tetanus status.  There was no Tdap in her immunization tab, but it did say in the narrative in her family medicine visit in February that she was going to be administered tetanus booster.  On questioning today, the patient states that staff did not get to administer that immunization in February.  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      Home Medications    Prior to Admission medications   Medication Sig Start Date End Date Taking? Authorizing Provider  mupirocin ointment (BACTROBAN) 2 % Apply 1 Application topically 2 (two) times daily. To affected area till better 04/22/23  Yes Harmoni Lucus, Janace Aris, MD  methocarbamol (ROBAXIN) 500 MG tablet Take 1 tablet as needed by oral route at bedtime for 30 days. 12/21/22   [provider]    Family History History reviewed. No pertinent family history.  Social History Social  History   Tobacco Use   Smoking status: Every Day   Smokeless tobacco: Current     Allergies   Patient has no known allergies.   Review of Systems Review of Systems   Physical Exam Triage Vital Signs ED Triage Vitals [04/22/23 1545]  Enc Vitals Group     BP 126/81     Pulse Rate 82     Resp 17     Temp 98 F (36.7 C)     Temp Source Oral     SpO2 92 %     Weight      Height      Head Circumference      Peak Flow      Pain Score      Pain Loc      Pain Edu?      Excl. in GC?    No data found.  Updated Vital Signs BP 126/81 (BP Location: Left Arm)   Pulse 82   Temp 98 F (36.7 C) (Oral)   Resp 17   LMP 05/13/2016 (Exact Date)   SpO2 92%   Visual Acuity Right Eye Distance:   Left Eye Distance:   Bilateral Distance:    Right Eye Near:   Left Eye Near:    Bilateral Near:     Physical Exam Vitals reviewed.  Constitutional:  General: She is not in acute distress.    Appearance: She is not ill-appearing, toxic-appearing or diaphoretic.  HENT:     Nose:     Comments: There is a linear abrasion that is not bleeding and is about 1 cm in length on the tip of her nose.  There is no erythema.  It does not gape and it is shallow. Neurological:     General: No focal deficit present.     Mental Status: She is alert and oriented to person, place, and time.  Psychiatric:        Behavior: Behavior normal.      UC Treatments / Results  Labs (all labs ordered are listed, but only abnormal results are displayed) Labs Reviewed - No data to display  EKG   Radiology No results found.  Procedures Procedures (including critical care time)  Medications Ordered in UC Medications  Tdap (BOOSTRIX) injection 0.5 mL (has no administration in time range)    Initial Impression / Assessment and Plan / UC Course  I have reviewed the triage vital signs and the nursing notes.  Pertinent labs & imaging results that were available during my care of the patient  were reviewed by me and considered in my medical decision making (see chart for details).        Tdap booster is given today.  This abrasion does not need suturing. Final Clinical Impressions(s) / UC Diagnoses   Final diagnoses:  Abrasion, nose w/o infection     Discharge Instructions      Put mupirocin ointment on the sore areas twice daily until improved  You have been given a Tdap vaccination to boost your tetanus immunity       ED Prescriptions     Medication Sig Dispense Auth. Provider   mupirocin ointment (BACTROBAN) 2 % Apply 1 Application topically 2 (two) times daily. To affected area till better 22 g Marlinda Mike Janace Aris, MD      PDMP not reviewed this encounter.   Zenia Resides, MD 04/22/23 [phone]

## 2023-05-09 ENCOUNTER — Encounter: Payer: Commercial Managed Care - HMO | Admitting: Student

## 2023-10-03 IMAGING — DX DG FINGER LITTLE 2+V*R*
3 series · 3 of 3 positions shown · non-contrast
Comparison: None.

CLINICAL DATA: Relocation, right pinky finger pain

EXAM:
RIGHT LITTLE FINGER 2+V

[finger ap]
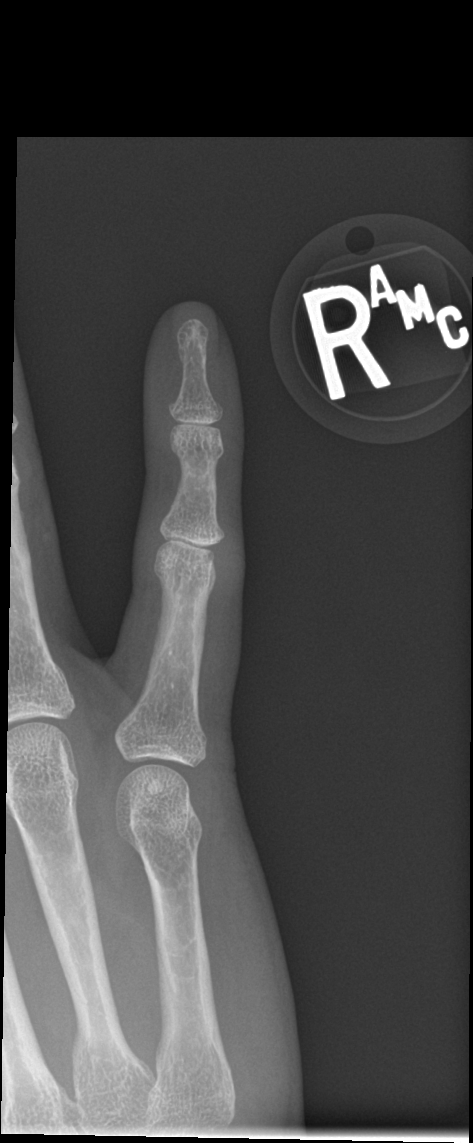

[finger obl]
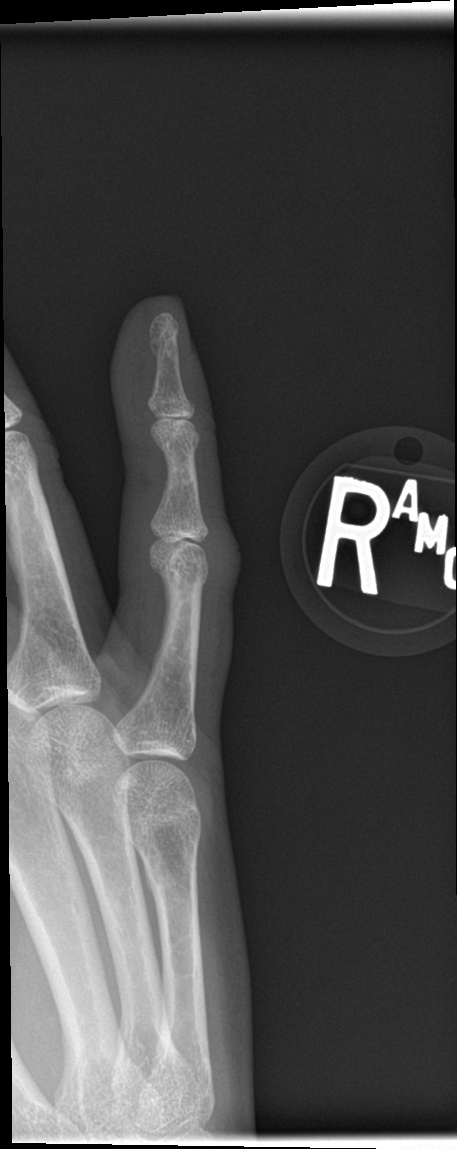

[finger lat]
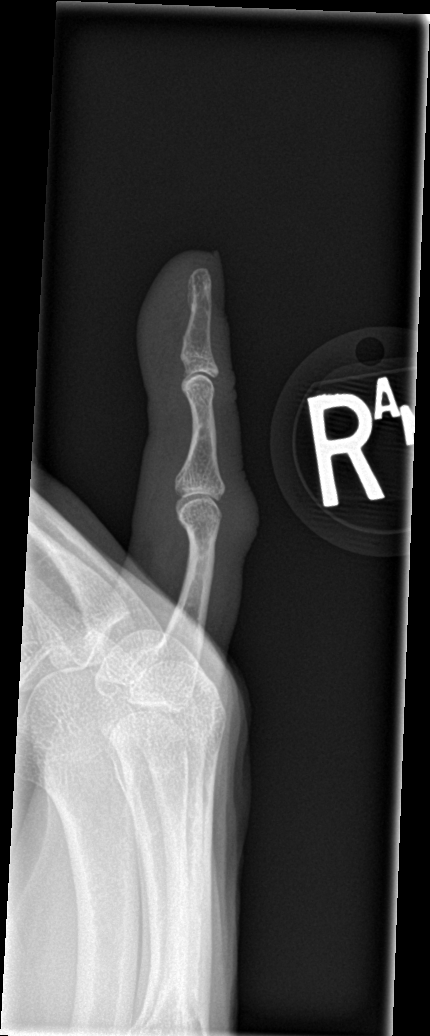

[3 of 3 positions shown; findings below may reference images not displayed]

FINDINGS: There is mild soft tissue swelling of the small finger. There is a
probable nondisplaced fracture at the base of the distal phalanx
with oblique longitudinal extension. Alignment is normal.
IMPRESSION: Probable nondisplaced fracture at the base of the fifth digit distal
phalanx with oblique longitudinal extension. Normal interphalangeal
joint alignment. Mild soft tissue swelling.

## 2023-11-06 ENCOUNTER — Ambulatory Visit: Payer: Commercial Managed Care - HMO | Admitting: Dermatology

## 2023-11-18 ENCOUNTER — Ambulatory Visit (INDEPENDENT_AMBULATORY_CARE_PROVIDER_SITE_OTHER): Payer: Self-pay | Admitting: Dermatology

## 2023-11-18 ENCOUNTER — Other Ambulatory Visit: Payer: Self-pay | Admitting: Dermatology

## 2023-11-18 ENCOUNTER — Encounter: Payer: Self-pay | Admitting: Dermatology

## 2023-11-18 ENCOUNTER — Ambulatory Visit (HOSPITAL_COMMUNITY)
Admission: RE | Admit: 2023-11-18 | Discharge: 2023-11-18 | Disposition: A | Discharge status: Home / Self Care | Payer: 59 | Source: Ambulatory Visit | Attending: Dermatology | Admitting: Dermatology

## 2023-11-18 VITALS — BP 138/87 | HR 90

## 2023-11-18 DIAGNOSIS — R229 Localized swelling, mass and lump, unspecified: Secondary | ICD-10-CM | POA: Insufficient documentation

## 2023-11-18 DIAGNOSIS — R2241 Localized swelling, mass and lump, right lower limb: Secondary | ICD-10-CM

## 2023-11-18 NOTE — Patient Instructions (Addendum)
Hello Michele Vazquez,  Thank you for visiting our clinic today. Your proactive approach towards addressing your health concerns is greatly appreciated. Here is a summary of the key instructions from today's consultation:  - Ultrasound Examination: You are instructed to undergo a STAT ultrasound to assess the lump on your thigh. This is crucial for determining the appropriate surgical approach.   - Location: Please proceed to [facility] for the ultrasound as soon as possible.  - Surgical Consultation: Pending the results of your ultrasound, we will schedule a consultation with Dr. Caralyn Guile to discuss the potential surgical removal of the lump.  - Follow-Up: Our office will contact you with the ultrasound results and provide further instructions on scheduling your surgery.  Thank you once again for entrusting [place] with your care. Should you have any questions or require further assistance, please do not hesitate to reach out to our office.  Warm regards,  Dr. Langston Reusing, Dermatology   Important Information  Due to recent changes in healthcare laws, you may see results of your pathology and/or laboratory studies on MyChart before the doctors have had a chance to review them. We understand that in some cases there may be results that are confusing or concerning to you. Please understand that not all results are received at the same time and often the doctors may need to interpret multiple results in order to provide you with the best plan of care or course of treatment. Therefore, we ask that you please give [place] 2 business days to thoroughly review all your results before contacting the office for clarification. Should we see a critical lab result, you will be contacted sooner.   If You Need Anything After Your Visit  If you have any questions or concerns for your doctor, please call our main line at [phone] If no one answers, please leave a voicemail as directed and we will return your call as soon  as possible. Messages left after 4 pm will be answered the following business day.   You may also send [place] a message via MyChart. We typically respond to MyChart messages within 1-2 business days.  For prescription refills, please ask your pharmacy to contact our office. Our fax number is [phone].  If you have an urgent issue when the clinic is closed that cannot wait until the next business day, you can page your doctor at the number below.    Please note that while we do our best to be available for urgent issues outside of office hours, we are not available 24/7.   If you have an urgent issue and are unable to reach [place], you may choose to seek medical care at your doctor's office, retail clinic, urgent care center, or emergency room.  If you have a medical emergency, please immediately call 911 or go to the emergency department. In the event of inclement weather, please call our main line at [phone] for an update on the status of any delays or closures.  Dermatology Medication Tips: Please keep the boxes that topical medications come in in order to help keep track of the instructions about where and how to use these. Pharmacies typically print the medication instructions only on the boxes and not directly on the medication tubes.   If your medication is too expensive, please contact our office at [phone] or send [place] a message through MyChart.   We are unable to tell what your co-pay for medications will be in advance as this is different depending on  your insurance coverage. However, we may be able to find a substitute medication at lower cost or fill out paperwork to get insurance to cover a needed medication.   If a prior authorization is required to get your medication covered by your insurance company, please allow [place] 1-2 business days to complete this process.  Drug prices often vary depending on where the prescription is filled and some pharmacies may offer cheaper  prices.  The website www.goodrx.com contains coupons for medications through different pharmacies. The prices here do not account for what the cost may be with help from insurance (it may be cheaper with your insurance), but the website can give you the price if you did not use any insurance.  - You can print the associated coupon and take it with your prescription to the pharmacy.  - You may also stop by our office during regular business hours and pick up a GoodRx coupon card.  - If you need your prescription sent electronically to a different pharmacy, notify our office through [facility] or by phone at [phone]

## 2023-11-18 NOTE — Progress Notes (Signed)
New Patient Visit   Subjective  Michele Vazquez is a 52 y.o. female who presents for the following: New Pt - Lump on R thigh  Patient states she has lump located at the right thigh that she would like to have examined. Patient reports the areas have been there for 5 years. She reports the areas are at times bothersome.Patient rates irritation 4 out of 10 stating that it feels warm. She states that the areas have not spread. Patient reports she has previously been treated for these areas by urgent care when it first occurred 5 years ago and was instructed that apply ice. Patient denies Hx of bx. Patient denies family history of skin cancer(s).  The patient has spots, moles and lesions to be evaluated, some may be new or changing and the patient may have concern these could be cancer.   The following portions of the chart were reviewed this encounter and updated as appropriate: medications, allergies, medical history  Review of Systems:  No other skin or systemic complaints except as noted in HPI or Assessment and Plan.  Objective  Well appearing patient in no apparent distress; mood and affect are within normal limits.    A focused examination was performed of the following areas: left thigh   Relevant exam findings are noted in the Assessment and Plan.          Assessment & Plan   1. Subcutaneous Mass  on Thigh - Assessment: Patient presents with a 6 cm firm, lumpy subcutaneous nodule on the thigh, which has increased in size from that of a quarter. The nodule is associated with intermittent sensations of pins and needles and a burning feeling. It is non-tender upon palpation and occasionally exhibits a purple discoloration. A family history of cancer is noted. - Plan:   - Obtain STAT ultrasound of the lesion to evaluate the nodule and its relationship to surrounding structures.   - Document the lesion with photographs.   - Refer to Dr. Caralyn Guile (surgeon) or Oncology for potential  bx or excision, pending ultrasound results.   - Follow up with the patient to communicate ultrasound results and to schedule a surgical appointment if appropriate.  Update:  Pt was able to have her STAT US performed immediately after leaving the office and her results were available by end of day.  Results showed vascularity within the mass and concern for malignancy vs an enlarged lymphnode.  I will refer to oncology or interventional radiology for an ultrasound guided- FNA.    Results of [place]: Narrative & Impression  CLINICAL DATA:  Right thigh palpable and painful mass.   EXAM: ULTRASOUND right LOWER EXTREMITY LIMITED   TECHNIQUE: Ultrasound examination of the lower extremity soft tissues was performed in the area of clinical concern.   COMPARISON:  None Available.   FINDINGS: There is a 4.3 x 2.8 x 4.9 cm solid mass with irregular and lobulated margins in the subcutaneous soft tissues of the medial right thigh corresponding to the palpable lump. This mass is hypoechoic to the surrounding subcutaneous tissue. Color images demonstrate flow within this mass. This mass is concerning for a soft tissue malignancy or possibly an abnormally enlarged metastatic lymph node or lymphoma. Clinical correlation and further evaluation with ultrasound-guided FNA recommended.   IMPRESSION: Solid mass concerning for malignancy.  Tissue sampling is advised.     Electronically Signed   By: Elgie Collard M.D.   On: 11/18/2023 15:30  Subcutaneous mass    No follow-ups on file.    Documentation: I have reviewed the above documentation for accuracy and completeness, and I agree with the above.   I, Shirron Marcha Solders, CMA, am acting as scribe for [employer], DO.   Langston Reusing, DO

## 2023-11-19 ENCOUNTER — Other Ambulatory Visit: Payer: Self-pay

## 2023-11-19 NOTE — Progress Notes (Signed)
Hi Hollie,  Can you please send a referral and/or an order to interventional radiology for a FNA (fine needle aspiration bx) to r/o malignancy.  Thanks!

## 2023-11-20 ENCOUNTER — Ambulatory Visit: Payer: Self-pay

## 2023-11-20 NOTE — Telephone Encounter (Signed)
  Chief Complaint: Information Only Call Disposition: [] ED /[] Urgent Care (no appt availability in office) / [] Appointment(In office/virtual)/ []  [place] [facility]/ [] Home Care/ [] Refused Recommended Disposition /[] [place] [facility]/ [x]  Follow-up with PCP Additional Notes: Michele Vazquez is a 52 year old female who called in regarding a missed call for lab results. The patient does not have an established [facility] Provider or PCP, and together, we were unable to determine the origin of the call as the patient had a missed call from two phone numbers, [phone]  and [phone]. The patient was unsure if the missed results were for her or her uncle, Advised the patient to thoroughly check her MyChart and have her uncle check his, and if any additional follow up or additional information is needed, to contact her PCP office.    Reason for Disposition  [1] Other NON-URGENT information for PCP AND [2] does not require PCP response  Answer Assessment - Initial Assessment Questions 1. REASON FOR CALL or QUESTION: "What is your reason for calling today?" or "How can I best help you?" or "What question do you have that I can help answer?"     Missed Call for Lab result, No [place] PCP.  Answer Assessment - Initial Assessment Questions 1. REASON FOR CALL or QUESTION: "What is your reason for calling today?" or "How can I best help you?" or "What question do you have that I can help answer?"     "Missed a call for lab results, unsure of the result or reason." 2. CALLER: Document the source of call. (e.g., laboratory, patient).     Patient  Protocols used: Information Only Call - No Triage-A-AH, PCP Call - No Triage-A-AH

## 2023-11-21 ENCOUNTER — Telehealth: Payer: Self-pay

## 2023-11-21 DIAGNOSIS — R2241 Localized swelling, mass and lump, right lower limb: Secondary | ICD-10-CM

## 2023-11-21 NOTE — Progress Notes (Signed)
Richarda Overlie, MD  Claudean Kinds US guided core biopsy of right thigh mass.  Sedation at patient's discretion.  [place]       Previous Messages    ----- Message ----- From: Claudean Kinds Sent: 11/21/2023   9:34 AM EST To: Claudean Kinds; Ir Procedure Requests Subject: [place] FNA SOFT TISSUE                            Procedure: [place] FNA SOFT TISSUE  STAT  Reason : solid mass concerning for malignancy Dx: Subcutaneous mass of right lower extremity [R22.41 (ICD-10-CM)]  History : US Soft tissue lower ext   Provider : Terri Piedra, DO  Provider contact [phone]

## 2023-11-21 NOTE — Telephone Encounter (Signed)
-----   Message from Langston Reusing sent at 11/21/2023  7:05 AM EST ----- Any updates?

## 2023-11-21 NOTE — Telephone Encounter (Signed)
Spoke to patient regarding [place] results and advised her that an additional [place] with an biopsy was needed. I put in an order for [place] with FNA biopsy and advised patient that someone from scheduling would reach out to her to schedule the appointment/hd

## 2023-12-09 ENCOUNTER — Encounter (HOSPITAL_COMMUNITY): Payer: Self-pay | Admitting: Radiology

## 2023-12-13 ENCOUNTER — Ambulatory Visit (HOSPITAL_COMMUNITY)
Admission: RE | Admit: 2023-12-13 | Discharge: 2023-12-13 | Disposition: A | Discharge status: Home / Self Care | Payer: Self-pay | Source: Ambulatory Visit | Attending: Dermatology | Admitting: Dermatology

## 2023-12-13 DIAGNOSIS — R2241 Localized swelling, mass and lump, right lower limb: Secondary | ICD-10-CM | POA: Insufficient documentation

## 2023-12-13 MED ORDER — LIDOCAINE HCL (PF) 1 % IJ SOLN: 10.0000 mL | Freq: Once | INTRAMUSCULAR | Status: DC

## 2023-12-13 NOTE — Procedures (Signed)
 Interventional Radiology Procedure Note  Procedure:   US guided right thigh mass biopsy. Mx 16g core  Complications: None Recommendations:  - Ok to shower tomorrow - Do not submerge for 7 days - Routine care   Signed,  Yvone Neu. Loreta Ave, DO

## 2023-12-17 LAB — SURGICAL PATHOLOGY

## 2024-02-15 ENCOUNTER — Emergency Department (HOSPITAL_COMMUNITY)
Admission: EM | Admit: 2024-02-15 | Discharge: 2024-02-15 | Disposition: A | Discharge status: Home / Self Care | Payer: Self-pay | Attending: Emergency Medicine | Admitting: Emergency Medicine

## 2024-02-15 ENCOUNTER — Encounter (HOSPITAL_COMMUNITY): Payer: Self-pay | Admitting: Emergency Medicine

## 2024-02-15 ENCOUNTER — Emergency Department (HOSPITAL_COMMUNITY): Payer: Self-pay

## 2024-02-15 ENCOUNTER — Other Ambulatory Visit: Payer: Self-pay

## 2024-02-15 DIAGNOSIS — I493 Ventricular premature depolarization: Secondary | ICD-10-CM | POA: Insufficient documentation

## 2024-02-15 LAB — BASIC METABOLIC PANEL
Anion gap: 9 (ref 5–15)
BUN: 16 mg/dL (ref 6–20)
CO2: 23 mmol/L (ref 22–32)
Calcium: 9.8 mg/dL (ref 8.9–10.3)
Chloride: 109 mmol/L (ref 98–111)
Creatinine, Ser: 0.67 mg/dL (ref 0.44–1.00)
GFR, Estimated: >60 mL/min (ref >60)
Glucose, Bld: 115 mg/dL — ABNORMAL HIGH (ref 70–99)
Potassium: 3.9 mmol/L (ref 3.5–5.1)
Sodium: 141 mmol/L (ref 135–145)

## 2024-02-15 LAB — CBC
HCT: 45.5 % (ref 36.0–46.0)
Hemoglobin: 14.9 g/dL (ref 12.0–15.0)
MCH: 33.2 pg (ref 26.0–34.0)
MCHC: 32.7 g/dL (ref 30.0–36.0)
MCV: 101.3 fL — ABNORMAL HIGH (ref 80.0–100.0)
Platelets: 243 10*3/uL (ref 150–400)
RBC: 4.49 MIL/uL (ref 3.87–5.11)
RDW: 11.8 % (ref 11.5–15.5)
WBC: 8.2 10*3/uL (ref 4.0–10.5)
nRBC: 0 % (ref 0.0–0.2)

## 2024-02-15 LAB — TROPONIN I (HIGH SENSITIVITY): Troponin I (High Sensitivity): 3 ng/L (ref <18)

## 2024-02-15 MED ORDER — POTASSIUM CHLORIDE CRYS ER 20 MEQ PO TBCR
40.0000 meq | EXTENDED_RELEASE_TABLET | Freq: Once | ORAL | Status: AC
  Administered 2024-02-15: 40 meq via ORAL
  Filled 2024-02-15: qty 4

## 2024-02-15 MED ORDER — ALBUTEROL SULFATE HFA 108 (90 BASE) MCG/ACT IN AERS: 2.0000 | INHALATION_SPRAY | RESPIRATORY_TRACT | Status: DC | PRN

## 2024-02-15 MED ORDER — MAGNESIUM OXIDE -MG SUPPLEMENT 400 (240 MG) MG PO TABS
800.0000 mg | ORAL_TABLET | Freq: Once | ORAL | Status: AC
  Administered 2024-02-15: 800 mg via ORAL
  Filled 2024-02-15: qty 2

## 2024-02-15 NOTE — ED Triage Notes (Signed)
 The last couple of days, the patient has experienced what she calls heart flutters and nausea. Symptoms worsened today and have been accompanied with feeling shaky and dizzy. She also reports occasional shortness of breath. Denies vomiting and diarrhea.

## 2024-02-15 NOTE — ED Notes (Signed)
 Save blue tube in main lab

## 2024-02-15 NOTE — Discharge Instructions (Signed)
 Please return for worsening symptoms if you are coughing up blood or if you pass out.  You can try magnesium supplements at home.  I did place the referral to the cardiologist to call and try and set up an appointment.

## 2024-02-15 NOTE — ED Provider Notes (Signed)
 [place] EMERGENCY DEPARTMENT AT [facility] Provider Note   CSN: [id number] Arrival date & time: 02/15/24  1809     History  Chief Complaint  Patient presents with   Shortness of Breath   Palpitations    Michele Vazquez is a 53 y.o. female.  53 yo F with a chief complaints of feeling like her heart is beating funny.  This has been going on for a little bit over a day.  Seems to be persistent and may be worsening.  She denies any chest pain or pressure.  Denies difficulty breathing.  Denies cough congestion or fever.  She feels like she is been eating and drinking normally.  No recent medication changes.  No nausea vomiting or diarrhea.   Shortness of Breath Palpitations Associated symptoms: shortness of breath        Home Medications Prior to Admission medications   Medication Sig Start Date End Date Taking? Authorizing Provider  methocarbamol (ROBAXIN) 500 MG tablet Take 1 tablet as needed by oral route at bedtime for 30 days. 12/21/22   [provider]  mupirocin ointment (BACTROBAN) 2 % Apply 1 Application topically 2 (two) times daily. To affected area till better 04/22/23   Zenia Resides, MD      Allergies    Patient has no known allergies.    Review of Systems   Review of Systems  Respiratory:  Positive for shortness of breath.   Cardiovascular:  Positive for palpitations.    Physical Exam Updated Vital Signs BP 134/78 (BP Location: Right Arm)   Pulse (!) 103   Temp 98 F (36.7 C) (Oral)   Resp 18   Ht 5\' 5"  (1.651 m)   Wt 65.8 kg   LMP 05/13/2016 (Exact Date)   SpO2 97%   BMI 24.13 kg/m  Physical Exam Vitals and nursing note reviewed.  Constitutional:      General: She is not in acute distress.    Appearance: She is well-developed. She is not diaphoretic.  HENT:     Head: Normocephalic and atraumatic.  Eyes:     Pupils: Pupils are equal, round, and reactive to light.  Cardiovascular:     Rate and Rhythm: Normal rate  and regular rhythm.     Heart sounds: No murmur heard.    No friction rub. No gallop.  Pulmonary:     Effort: Pulmonary effort is normal.     Breath sounds: No wheezing or rales.  Abdominal:     General: There is no distension.     Palpations: Abdomen is soft.     Tenderness: There is no abdominal tenderness.  Musculoskeletal:        General: No tenderness.     Cervical back: Normal range of motion and neck supple.  Skin:    General: Skin is warm and dry.  Neurological:     Mental Status: She is alert and oriented to person, place, and time.  Psychiatric:        Behavior: Behavior normal.     ED Results / Procedures / Treatments   Labs (all labs ordered are listed, but only abnormal results are displayed) Labs Reviewed  BASIC METABOLIC PANEL - Abnormal; Notable for the following components:      Result Value   Glucose, Bld 115 (*)    All other components within normal limits  CBC - Abnormal; Notable for the following components:   MCV 101.3 (*)    All other components within  normal limits  TROPONIN I (HIGH SENSITIVITY)  TROPONIN I (HIGH SENSITIVITY)    EKG EKG Interpretation Date/Time:  Saturday February 15 2024 18:19:35 EST Ventricular Rate:  101 PR Interval:  120 QRS Duration:  81 QT Interval:  313 QTC Calculation: 406 R Axis:   109  Text Interpretation: Sinus tachycardia Multiple ventricular premature complexes Right axis deviation No significant change since last tracing Confirmed by Melene Plan [phone]) on 02/15/2024 7:42:05 PM  Radiology DG Chest 2 View Result Date: 02/15/2024 CLINICAL DATA:  Chest pain EXAM: CHEST - 2 VIEW COMPARISON:  None Available. FINDINGS: The heart size and mediastinal contours are within normal limits. Both lungs are clear. The visualized skeletal structures are unremarkable. IMPRESSION: No active cardiopulmonary disease. Electronically Signed   By: Darliss Cheney M.D.   On: 02/15/2024 19:13    Procedures .1-3 Lead EKG  Interpretation  Performed by: Melene Plan, DO Authorized by: Melene Plan, DO     Interpretation: normal     ECG rate:  91   ECG rate assessment: normal     Rhythm: sinus rhythm     Ectopy: PVCs     Conduction: normal       Medications Ordered in ED Medications  albuterol (VENTOLIN HFA) 108 (90 Base) MCG/ACT inhaler 2 puff (has no administration in time range)  potassium chloride SA (KLOR-CON M) CR tablet 40 mEq (40 mEq Oral Given 02/15/24 1956)  magnesium oxide (MAG-OX) tablet 800 mg (800 mg Oral Given 02/15/24 1956)    ED Course/ Medical Decision Making/ A&P                                 Medical Decision Making Amount and/or Complexity of Data Reviewed Labs: ordered. Radiology: ordered.  Risk OTC drugs. Prescription drug management.   53 yo F with a chief complaint of palpitations.  Patient is having PVCs on the monitor.  Feels like she is having palpitations consistent with telemetry.  Troponins negative no significant electrolyte abnormalities.  Potassium is slightly less than 4.  Will give a dose of potassium and magnesium here.  PCP and cardiology follow-up.  8:08 PM:  I have discussed the diagnosis/risks/treatment options with the patient.  Evaluation and diagnostic testing in the emergency department does not suggest an emergent condition requiring admission or immediate intervention beyond what has been performed at this time.  They will follow up with PCP. We also discussed returning to the ED immediately if new or worsening sx occur. We discussed the sx which are most concerning (e.g., sudden worsening pain, fever, inability to tolerate by mouth) that necessitate immediate return. Medications administered to the patient during their visit and any new prescriptions provided to the patient are listed below.  Medications given during this visit Medications  albuterol (VENTOLIN HFA) 108 (90 Base) MCG/ACT inhaler 2 puff (has no administration in time range)  potassium  chloride SA (KLOR-CON M) CR tablet 40 mEq (40 mEq Oral Given 02/15/24 1956)  magnesium oxide (MAG-OX) tablet 800 mg (800 mg Oral Given 02/15/24 1956)     The patient appears reasonably screen and/or stabilized for discharge and I doubt any other medical condition or other [facility] requiring further screening, evaluation, or treatment in the ED at this time prior to discharge.         Final Clinical Impression(s) / ED Diagnoses Final diagnoses:  PVC (premature ventricular contraction)    Rx / DC Orders ED Discharge  Orders          Ordered    Ambulatory referral to Cardiology       Comments: If you have not heard from the Cardiology office within the next 72 hours please call [phone].   02/15/24 2005              Melene Plan, DO 02/15/24 2008

## 2024-05-06 DIAGNOSIS — I493 Ventricular premature depolarization: Secondary | ICD-10-CM | POA: Insufficient documentation

## 2024-05-06 NOTE — Progress Notes (Unsigned)
 Cardiology Office Note   Date:  05/07/2024   ID:  Michele Vazquez, DOB 1971/10/22, MRN [id number]  PCP:  Pcp, No  Cardiologist:   Eilleen Grates, MD Referring:  Pcp, No  No chief complaint on file.     History of Present Illness: Michele Vazquez is a 53 y.o. female who presents for evaluation of PVCs.   She was in the emergency room for this in March.  I reviewed these records for this visit.  There were no electrolyte abnormalities.  EKG was unremarkable.  She said that she feels palpitations that feel like butterflies in her chest.  She is not describing sustained tachyarrhythmias.  They seem to be reproducible with some activities but also seem to occur at rest.  She is not describing presyncope or syncope.  She has had decreased exercise tolerance.  She mows her lawn which is a very big yard.  He usually takes her an hour and a half to do the front and she said she has had take a lot of breaks recently and it took her several hours to do it not long ago.  She might of have more palpitations with that.  She gets short of breath but she is not describing substernal chest pressure, neck or arm discomfort.  She has had no weight gain or edema.   Past Medical History:  Diagnosis Date   Ulcerative colitis (HCC)     Past Surgical History:  Procedure Laterality Date   EYE SURGERY       Current Outpatient Medications  Medication Sig Dispense Refill   methocarbamol (ROBAXIN) 500 MG tablet Take 1 tablet as needed by oral route at bedtime for 30 days.     mupirocin  ointment (BACTROBAN ) 2 % Apply 1 Application topically 2 (two) times daily. To affected area till better 22 g 0   No current facility-administered medications for this visit.    Allergies:   Patient has no known allergies.    Social History:  The patient  reports that she has been smoking. She uses smokeless tobacco.   Family History:  The patient's family history includes Cancer in her father; Heart disease in  her father.    ROS:  Please see the history of present illness.   Otherwise, review of systems are positive for none.   All other systems are reviewed and negative.    PHYSICAL EXAM: VS:  BP 102/62 (BP Location: Left Arm, Patient Position: Sitting)   Pulse 93   Ht 5\' 5"  (1.651 m)   Wt 154 lb 9.6 oz (70.1 kg)   LMP 05/13/2016 (Exact Date)   SpO2 93%   BMI 25.73 kg/m  , BMI Body mass index is 25.73 kg/m. GENERAL:  Well appearing HEENT:  Pupils equal round and reactive, fundi not visualized, oral mucosa unremarkable NECK:  No jugular venous distention, waveform within normal limits, carotid upstroke brisk and symmetric, no bruits, no thyromegaly LYMPHATICS:  No cervical, inguinal adenopathy LUNGS:  Clear to auscultation bilaterally BACK:  No CVA tenderness CHEST:  Unremarkable HEART:  PMI not displaced or sustained,S1 and S2 within normal limits, no S3, no S4, no clicks, no rubs, no murmurs ABD:  Flat, positive bowel sounds normal in frequency in pitch, no bruits, no rebound, no guarding, no midline pulsatile mass, no hepatomegaly, no splenomegaly EXT:  2 plus pulses throughout, no edema, no cyanosis no clubbing SKIN:  No rashes no nodules NEURO:  Cranial nerves II through XII grossly intact, motor  grossly intact throughout [facility]:  Cognitively intact, oriented to person place and time    EKG:  EKG Interpretation Date/Time:  Thursday May 07 2024 11:37:22 EDT Ventricular Rate:  93 PR Interval:  114 QRS Duration:  76 QT Interval:  324 QTC Calculation: 402 R Axis:   93  Text Interpretation: Normal sinus rhythm with sinus arrhythmia Rightward axis When compared with ECG of 15-Feb-2024 18:19,  No PVCs present Confirmed by Eilleen Grates ([id number]) on 05/07/2024 11:55:37 AM     Recent Labs: 02/15/2024: BUN 16; Creatinine, Ser 0.67; Hemoglobin 14.9; Platelets 243; Potassium 3.9; Sodium 141    Lipid Panel    Component Value Date/Time   CHOL 230 (H) 03/19/2023 0000   TRIG 243 (H)  03/19/2023 0000   HDL 74 03/19/2023 0000   CHOLHDL 3.1 03/19/2023 0000   LDLCALC 115 (H) 03/19/2023 0000      Wt Readings from Last 3 Encounters:  05/07/24 154 lb 9.6 oz (70.1 kg)  02/15/24 145 lb (65.8 kg)  03/19/23 146 lb 6.4 oz (66.4 kg)      Other studies Reviewed: Additional studies/ records that were reviewed today include: ED records. Review of the above records demonstrates:  Please see elsewhere in the note.     ASSESSMENT AND PLAN:  PVCs:   She did have PVCs on her previous EKG.  I am going to quantify this with a monitor.  I did ask her to do her usual activities including pushing lawnmower to see if I reproduce any symptoms.  Further management will be based on findings.  For now no change in therapy.  Decreased exercise tolerance: She has significant risk factors.  The pretest probability of obstructive coronary disease is somewhat lower.  I am going to start with a coronary calcium score.  If she has no calcium and no further testing would be suggested but further testing will be based on these results.  This will also help with goals of therapy.  Risk reduction: We talked at the need to stop smoking.  We talked about Chantix but she could not afford this.  I will be getting a lipid profile.    Other: Our social worker talk to her about insurance options.  Returned to help her consider Medicaid or get some coverage as she has between [employer].   Current medicines are reviewed at length with the patient today.  The patient does not have concerns regarding medicines.  The following changes have been made:  no change  Labs/ tests ordered today include:   Orders Placed This Encounter  Procedures   CT CARDIAC SCORING (SELF PAY ONLY)   Lipid panel   LONG TERM MONITOR (3-14 DAYS)   EKG 12-Lead     Disposition:   FU with with me based on the results of the above.     Signed, Eilleen Grates, MD  05/07/2024 12:36 PM    [facility]

## 2024-05-07 ENCOUNTER — Encounter: Payer: Self-pay | Admitting: Cardiology

## 2024-05-07 ENCOUNTER — Ambulatory Visit: Payer: Self-pay | Attending: Cardiology

## 2024-05-07 ENCOUNTER — Ambulatory Visit: Payer: Self-pay | Attending: Cardiology | Admitting: Cardiology

## 2024-05-07 VITALS — BP 102/62 | HR 93 | Ht 65.0 in | Wt 154.6 lb

## 2024-05-07 DIAGNOSIS — I493 Ventricular premature depolarization: Secondary | ICD-10-CM

## 2024-05-07 DIAGNOSIS — R7303 Prediabetes: Secondary | ICD-10-CM

## 2024-05-07 DIAGNOSIS — Z9189 Other specified personal risk factors, not elsewhere classified: Secondary | ICD-10-CM

## 2024-05-07 NOTE — Progress Notes (Unsigned)
 Enrolled for [employer] to mail a ZIO XT long term holter monitor to the patients address on file.

## 2024-05-07 NOTE — Patient Instructions (Signed)
 Medication Instructions:  No changes   *If you need a refill on your cardiac medications before your next appointment, please call your pharmacy*   Lab Work: Lipid fasting  If you have labs (blood work) drawn today and your tests are completely normal, you will receive your results only by: MyChart Message (if you have MyChart) OR A paper copy in the mail If you have any lab test that is abnormal or we need to change your treatment, we will call you to review the results.   Testing/Procedures: 1) Your physician has recommended that you wear a holter monitor.- 14 dayZio . Holter monitors are medical devices that record the heart's electrical activity. Doctors most often use these monitors to diagnose arrhythmias. Arrhythmias are problems with the speed or rhythm of the heartbeat. The monitor is a small, portable device. You can wear one while you do your normal daily activities. This is usually used to diagnose what is causing palpitations/syncope (passing out).       2) CT coronary calcium score.   Test locations:  [facility]  [facility]  This is $99 out of pocket.   Coronary CalciumScan A coronary calcium scan is an imaging test used to look for deposits of calcium and other fatty materials (plaques) in the inner lining of the blood vessels of the heart (coronary arteries). These deposits of calcium and plaques can partly clog and narrow the coronary arteries without producing any symptoms or warning signs. This puts a person at risk for a heart attack. This test can detect these deposits before symptoms develop. Tell a health care provider about: Any allergies you have. All medicines you are taking, including vitamins, herbs, eye drops, creams, and over-the-counter medicines. Any problems you or family members have had with anesthetic medicines. Any blood disorders you have. Any  surgeries you have had. Any medical conditions you have. Whether you are pregnant or may be pregnant. What are the risks? Generally, this is a safe procedure. However, problems may occur, including: Harm to a pregnant woman and her unborn baby. This test involves the use of radiation. Radiation exposure can be dangerous to a pregnant woman and her unborn baby. If you are pregnant, you generally should not have this procedure done. Slight increase in the risk of cancer. This is because of the radiation involved in the test. What happens before the procedure? No preparation is needed for this procedure. What happens during the procedure? You will undress and remove any jewelry around your neck or chest. You will put on a hospital gown. Sticky electrodes will be placed on your chest. The electrodes will be connected to an electrocardiogram (ECG) machine to record a tracing of the electrical activity of your heart. A CT scanner will take pictures of your heart. During this time, you will be asked to lie still and hold your breath for 2-3 seconds while a picture of your heart is being taken. The procedure may vary among health care providers and hospitals. What happens after the procedure? You can get dressed. You can return to your normal activities. It is up to you to get the results of your test. Ask your health care provider, or the department that is doing the test, when your results will be ready. Summary A coronary calcium scan is an imaging test used to look for deposits of calcium and other fatty materials (plaques) in the inner lining of the  blood vessels of the heart (coronary arteries). Generally, this is a safe procedure. Tell your health care provider if you are pregnant or may be pregnant. No preparation is needed for this procedure. A CT scanner will take pictures of your heart. You can return to your normal activities after the scan is done. This information is not intended to  replace advice given to you by your health care provider. Make sure you discuss any questions you have with your health care provider. Document Released: 05/24/2008 Document Revised: 10/15/2016 Document Reviewed: 10/15/2016 Elsevier Interactive Patient Education  2017 [employer].   Follow-Up: At [facility], you and your health needs are our priority.  As part of our continuing mission to provide you with exceptional heart care, we have created designated Provider Care Teams.  These Care Teams include your primary Cardiologist (physician) and Advanced Practice Providers (APPs -  Physician Assistants and Nurse Practitioners) who all work together to provide you with the care you need, when you need it.     Your next appointment:   As needed depending on test results  The format for your next appointment:   In Person  Provider:   Eilleen Grates, MD   Other Instructions   ZIO XT- Long Term Monitor Instructions  Your physician has requested you wear a ZIO patch monitor for 14 days.  This is a single patch monitor. [employer] supplies one patch monitor per enrollment. Additional stickers are not available. Please do not apply patch if you will be having a Nuclear Stress Test,  Echocardiogram, Cardiac CT, MRI, or Chest Xray during the period you would be wearing the  monitor. The patch cannot be worn during these tests. You cannot remove and re-apply the  ZIO XT patch monitor.  Your ZIO patch monitor will be mailed 3 day USPS to your address on file. It may take 3-5 days  to receive your monitor after you have been enrolled.  Once you have received your monitor, please review the enclosed instructions. Your monitor  has already been registered assigning a specific monitor serial # to you.  Billing and Patient Assistance Program Information  We have supplied [employer] with any of your insurance information on file for billing purposes. [employer] offers a sliding scale Patient Assistance  Program for patients that do not have  insurance, or whose insurance does not completely cover the cost of the ZIO monitor.  You must apply for the Patient Assistance Program to qualify for this discounted rate.  To apply, please call [employer] at [phone], select option 4, select option 2, ask to apply for  Patient Assistance Program. Sanna Crystal will ask your household income, and how many people  are in your household. They will quote your out-of-pocket cost based on that information.  [employer] will also be able to set up a 18-month, interest-free payment plan if needed.  Applying the monitor   Shave hair from upper left chest.  Hold abrader disc by orange tab. Rub abrader in 40 strokes over the upper left chest as  indicated in your monitor instructions.  Clean area with 4 enclosed alcohol pads. Let dry.  Apply patch as indicated in monitor instructions. Patch will be placed under collarbone on left  side of chest with arrow pointing upward.  Rub patch adhesive wings for 2 minutes. Remove white label marked "1". Remove the white  label marked "2". Rub patch adhesive wings for 2 additional minutes.  While looking in a mirror, press and release button in  center of patch. A small green light will  flash 3-4 times. This will be your only indicator that the monitor has been turned on.  Do not shower for the first 24 hours. You may shower after the first 24 hours.  Press the button if you feel a symptom. You will hear a small click. Record Date, Time and  Symptom in the Patient Logbook.  When you are ready to remove the patch, follow instructions on the last 2 pages of Patient  Logbook. Stick patch monitor onto the last page of Patient Logbook.  Place Patient Logbook in the blue and white box. Use locking tab on box and tape box closed  securely. The blue and white box has prepaid postage on it. Please place it in the mailbox as  soon as possible. Your physician should have your test results  approximately 7 days after the  monitor has been mailed back to [facility].  Call [facility] Customer Care at [phone] if you have questions regarding  your ZIO XT patch monitor. Call them immediately if you see an orange light blinking on your  monitor.  If your monitor falls off in less than 4 days, contact our Monitor department at [phone].  If your monitor becomes loose or falls off after 4 days call [employer] at [phone] for  suggestions on securing your monitor

## 2024-05-08 NOTE — Progress Notes (Signed)
 Heart and Vascular Care Navigation  05/07/2024  Michele Vazquez 12-Jun-1971 [id number]  Reason for Referral: self pay no PCP Patient is participating in a Managed Medicaid Plan:No, self pay only  Engaged with patient face to face for initial visit for Heart and Vascular Care Coordination.                                                                                                   Assessment:      LCSW met with pt during clinic visit. Introduced self, role, reason for visit. Confirmed home address- resides with uncle and cousin and works full time. No insurance as it was too expensive through employer and had to pay on her taxes when she enrolled through Marketplace coverage. No issues with transportation currently, contributes to household expenses, her relatives manage the bills and her cousin receives [employer]. Finances can be tight, we discussed Medicaid eligibility and how to enroll with caseworkers at [facility] to skip DSS wait times. Pt declines referral for telephone application as she gets spam calls frequently and often doesn't answer her phone.   If pt not eligible for Medicaid and working on CAFA then we can assist her with completing and submitting that. She hopes to re-engage with PCP team [facility] if she is able to get coverage and I provided her with information on how to schedule that annual f/u.   Discussed testing ordered and up front costs of calcium score. Pt may be eligible for Patient Care Fund Assistance to free up funds for testing but we are unable to assist with copays at this time. Encouraged her to consider if needed and give me a call. Pt also has a monitor ordered which will have it's own financial assistance process which she should discuss with [employer].   No additional questions at this time. Will f/u with pt moving forward.                                  HRT/VAS Care Coordination     Patients Home Cardiology Office --   [facility] Team Social Worker   Social Worker Name: Nathen Balder, [place], [phone]   Living arrangements for the past 2 months Single Family Home   Lives with: Relatives   Patient Current Insurance Coverage Self-Pay   Patient Has Concern With Paying Medical Bills Yes   Patient Concerns With Medical Bills self pay   Medical Bill Referrals: Medicaid information for [facility] clinic;  [facility] Financial Assistance application   Does Patient Have Prescription Coverage? No       Social History:  SDOH Screenings   Food Insecurity: Food Insecurity Present (05/08/2024)  Housing: Low Risk  (05/08/2024)  Transportation Needs: No Transportation Needs (05/08/2024)  Utilities: Not At Risk (05/08/2024)  Depression (PHQ2-9): Low Risk  (03/19/2023)  Financial Resource Strain: Medium Risk (05/08/2024)  Tobacco Use: High Risk (05/07/2024)  Health Literacy: Adequate Health Literacy (05/08/2024)    SDOH Interventions: Financial Resources:  Surveyor, quantity Strain Interventions: Artist, Programmer, applications Provided DSS for financial assistance, Editor, commissioning for [employer], and [employer] Caseworker information at [employer] Insecurity:  Food Insecurity Interventions: [employer] Provided (pantry packet; cousin receives [employer])  Housing Insecurity:  Housing Interventions: Scientist, physiological:   Transportation Interventions: Intervention Not Indicated   Health Promotion Interventions:     Smoking Cessation Education Materials Given- will mail [facility] information   Other Care Navigation Interventions:     Provided Pharmacy assistance resources  No concerns noted at this time; encouraged pt to apply for Medicaid to assist with cost management  Patient expressed Mental Health concerns No.   Follow-up plan:   LCSW provided pt with  my card, [employer] Medicaid clinic and [employer]. Mailed food resources and rent/utility assistance materials as well as smoking cessation information. Will f/u to see if any questions/concerns arise.

## 2024-05-12 ENCOUNTER — Telehealth (HOSPITAL_BASED_OUTPATIENT_CLINIC_OR_DEPARTMENT_OTHER): Payer: Self-pay

## 2024-05-12 NOTE — Telephone Encounter (Signed)
-----   Message from Eilleen Grates sent at 05/12/2024  9:01 AM EDT -----   ----- Message ----- From: SYSTEM Sent: 05/12/2024  12:15 AM EDT To: Eilleen Grates, MD

## 2024-05-13 ENCOUNTER — Telehealth: Payer: Self-pay | Admitting: Licensed Clinical Social Worker

## 2024-05-13 NOTE — Telephone Encounter (Signed)
 H&V Care Navigation CSW Progress Note  Clinical Social Worker contacted patient by phone to f/u on patient assistance information provided and to see if pt had a chance to apply for Medicaid with assistance as needed. No answer today at [phone]. Left voicemail. Will re-attempt again as able.  Patient is participating in a Managed Medicaid Plan:  No, self pay only  SDOH Screenings   Food Insecurity: Food Insecurity Present (05/08/2024)  Housing: Low Risk  (05/08/2024)  Transportation Needs: No Transportation Needs (05/08/2024)  Utilities: Not At Risk (05/08/2024)  Depression (PHQ2-9): Low Risk  (03/19/2023)  Financial Resource Strain: Medium Risk (05/08/2024)  Tobacco Use: High Risk (05/07/2024)  Health Literacy: Adequate Health Literacy (05/08/2024)    Nathen Balder, MSW, LCSW Clinical Social Worker II [facility] Health Heart/Vascular Care Navigation  [phone]- work cell phone (preferred)

## 2024-05-19 ENCOUNTER — Telehealth: Payer: Self-pay | Admitting: Licensed Clinical Social Worker

## 2024-05-19 NOTE — Telephone Encounter (Signed)
 H&V Care Navigation CSW Progress Note  Clinical Social Worker contacted patient again by phone to f/u on patient assistance information provided and to see if pt had a chance to apply for Medicaid with assistance as needed. No answer again today at [phone]. Left 2nd voicemail. Will re-attempt again as able.   Patient is participating in a Managed Medicaid Plan:  No, self pay only  SDOH Screenings   Food Insecurity: Food Insecurity Present (05/08/2024)  Housing: Low Risk  (05/08/2024)  Transportation Needs: No Transportation Needs (05/08/2024)  Utilities: Not At Risk (05/08/2024)  Depression (PHQ2-9): Low Risk  (03/19/2023)  Financial Resource Strain: Medium Risk (05/08/2024)  Tobacco Use: High Risk (05/07/2024)  Health Literacy: Adequate Health Literacy (05/08/2024)     Nathen Balder, MSW, LCSW Clinical Social Worker II [facility] Health Heart/Vascular Care Navigation  [phone]- work cell phone (preferred)

## 2024-05-25 ENCOUNTER — Telehealth: Payer: Self-pay | Admitting: Licensed Clinical Social Worker

## 2024-05-25 NOTE — Telephone Encounter (Signed)
 H&V Care Navigation CSW Progress Note  Clinical Social Worker contacted patient by phone to  f/u on patient assistance information provided and to see if pt had a chance to apply for Medicaid with assistance as needed. Was able to make contact with pt today at [phone]. We verbally reviewed information provided and how to speak with Medicaid caseworkers at [facility] Pt will try and do that on her next day off, encouraged her to call me with any questions regarding the process or moving forward. If not eligible for Medicaid, may be eligible for [employer] for recent bills. Remain available as needed.   Patient is participating in a Managed Medicaid Plan:  No, self pay only  SDOH Screenings   Food Insecurity: Food Insecurity Present (05/08/2024)  Housing: Low Risk  (05/08/2024)  Transportation Needs: No Transportation Needs (05/08/2024)  Utilities: Not At Risk (05/08/2024)  Depression (PHQ2-9): Low Risk  (03/19/2023)  Financial Resource Strain: Medium Risk (05/08/2024)  Tobacco Use: High Risk (05/07/2024)  Health Literacy: Adequate Health Literacy (05/08/2024)    Nathen Balder, MSW, LCSW Clinical Social Worker II [facility] Health Heart/Vascular Care Navigation  [phone]- work cell phone (preferred)

## 2024-06-05 DIAGNOSIS — I493 Ventricular premature depolarization: Secondary | ICD-10-CM

## 2024-06-07 ENCOUNTER — Ambulatory Visit: Payer: Self-pay | Admitting: Cardiology

## 2024-06-10 ENCOUNTER — Ambulatory Visit (HOSPITAL_BASED_OUTPATIENT_CLINIC_OR_DEPARTMENT_OTHER)
Admission: RE | Admit: 2024-06-10 | Discharge: 2024-06-10 | Disposition: A | Discharge status: Home / Self Care | Payer: Self-pay | Source: Ambulatory Visit | Attending: Cardiology | Admitting: Cardiology

## 2024-06-10 DIAGNOSIS — I493 Ventricular premature depolarization: Secondary | ICD-10-CM | POA: Insufficient documentation

## 2024-06-10 LAB — LIPID PANEL

## 2024-06-11 LAB — LIPID PANEL
Cholesterol, Total: 205 mg/dL — AB (ref 100–199)
HDL: 66 mg/dL (ref >39)
LDL CALC COMMENT:: 3.1 ratio (ref 0.0–4.4)
LDL Chol Calc (NIH): 112 mg/dL — AB (ref 0–99)
Triglycerides: 158 mg/dL — ABNORMAL HIGH (ref 0–149)
VLDL Cholesterol Cal: 27 mg/dL (ref 5–40)

## 2024-06-12 ENCOUNTER — Emergency Department (HOSPITAL_COMMUNITY)
Admission: EM | Admit: 2024-06-12 | Discharge: 2024-06-12 | Disposition: A | Discharge status: Home / Self Care | Payer: Self-pay | Attending: Emergency Medicine | Admitting: Emergency Medicine

## 2024-06-12 ENCOUNTER — Other Ambulatory Visit: Payer: Self-pay

## 2024-06-12 ENCOUNTER — Encounter (HOSPITAL_COMMUNITY): Payer: Self-pay

## 2024-06-12 DIAGNOSIS — L03211 Cellulitis of face: Secondary | ICD-10-CM | POA: Insufficient documentation

## 2024-06-12 DIAGNOSIS — K047 Periapical abscess without sinus: Secondary | ICD-10-CM | POA: Insufficient documentation

## 2024-06-12 DIAGNOSIS — R000 Tachycardia, unspecified: Secondary | ICD-10-CM | POA: Insufficient documentation

## 2024-06-12 MED ORDER — AMOXICILLIN-POT CLAVULANATE 875-125 MG PO TABS: 1.0000 | ORAL_TABLET | Freq: Two times a day (BID) | ORAL | 0 refills | Status: AC

## 2024-06-12 MED ORDER — OXYCODONE-ACETAMINOPHEN 5-325 MG PO TABS
1.0000 | ORAL_TABLET | Freq: Once | ORAL | Status: AC
  Administered 2024-06-12: 1 via ORAL
  Filled 2024-06-12: qty 1

## 2024-06-12 MED ORDER — KETOROLAC TROMETHAMINE 15 MG/ML IJ SOLN
15.0000 mg | Freq: Once | INTRAMUSCULAR | Status: AC
  Administered 2024-06-12: 15 mg via INTRAMUSCULAR
  Filled 2024-06-12: qty 1

## 2024-06-12 MED ORDER — AMOXICILLIN-POT CLAVULANATE 875-125 MG PO TABS
1.0000 | ORAL_TABLET | Freq: Once | ORAL | Status: AC
  Administered 2024-06-12: 1 via ORAL
  Filled 2024-06-12: qty 1

## 2024-06-12 MED ORDER — OXYCODONE-ACETAMINOPHEN 5-325 MG PO TABS: 1.0000 | ORAL_TABLET | Freq: Three times a day (TID) | ORAL | 0 refills | Status: AC | PRN

## 2024-06-12 NOTE — ED Triage Notes (Signed)
 Patient woke up today with right sided lower jaw swelling. Able to swallow without problem. Painful to talk and eat. She thinks she has an abscess. Thinks she could have a broken tooth that caused this.

## 2024-06-12 NOTE — ED Provider Notes (Signed)
 [place] EMERGENCY DEPARTMENT AT [facility] Provider Note   CSN: [id number] Arrival date & time: 06/12/24  1737     Patient presents with: Facial Swelling   Michele Vazquez is a 53 y.o. female.   HPI Patient presents with facial pain.  Right-sided.  On the jaw.  States woke this morning with it.  No fevers.  States she felt she may have had a fever at home.  Potentially has dental issues.   Past Medical History:  Diagnosis Date   Ulcerative colitis (HCC)     Prior to Admission medications   Medication Sig Start Date End Date Taking? Authorizing Provider  amoxicillin -clavulanate (AUGMENTIN ) 875-125 MG tablet Take 1 tablet by mouth every 12 (twelve) hours. 06/12/24  Yes Patsey Lot, MD  oxyCODONE -acetaminophen  (PERCOCET/ROXICET) 5-325 MG tablet Take 1-2 tablets by mouth every 8 (eight) hours as needed for severe pain (pain score 7-10). 06/12/24  Yes Patsey Lot, MD  methocarbamol (ROBAXIN) 500 MG tablet Take 1 tablet as needed by oral route at bedtime for 30 days. 12/21/22   [provider]  mupirocin  ointment (BACTROBAN ) 2 % Apply 1 Application topically 2 (two) times daily. To affected area till better 04/22/23   Vonna Sharlet POUR, MD    Allergies: Patient has no known allergies.    Review of Systems  Updated Vital Signs BP (!) 152/85 (BP Location: Right Arm)   Pulse (!) 106   Temp 98.4 F (36.9 C) (Oral)   Resp 18   Ht 5' 5 (1.651 m)   Wt 70.3 kg   LMP 05/13/2016 (Exact Date)   SpO2 90%   BMI 25.79 kg/m   Physical Exam Vitals reviewed.  HENT:     Mouth/Throat:     Comments: Condition on right maxilla.  Some swelling of the jaw area of the cheek going towards the midline.  No fluctuance.  No swelling of floor mouth. Cardiovascular:     Rate and Rhythm: Tachycardia present.  Neurological:     Mental Status: She is alert.     (all labs ordered are listed, but only abnormal results are displayed) Labs Reviewed - No data to  display  EKG: None  Radiology: No results found.   Procedures   Medications Ordered in the ED  amoxicillin -clavulanate (AUGMENTIN ) 875-125 MG per tablet 1 tablet (1 tablet Oral Given 06/12/24 1822)                                    Medical Decision Making Risk Prescription drug management.   Patient with right-sided facial pain.  Differential diagnosis includes cellulitis or abscess.  Most likely dental source.  Mild tachycardia but otherwise well-appearing.  Will give antibiotics here.  Discussed about fall precautions.  Do not think we need imaging at time or blood work but if does not improve with oral antibiotics may need more expeditious workup.  No one is on-call for dentistry.       Final diagnoses:  Facial cellulitis  Dental infection    ED Discharge Orders          Ordered    amoxicillin -clavulanate (AUGMENTIN ) 875-125 MG tablet  Every 12 hours        06/12/24 1846    oxyCODONE -acetaminophen  (PERCOCET/ROXICET) 5-325 MG tablet  Every 8 hours PRN        06/12/24 1846  Patsey Lot, MD 06/12/24 [phone]

## 2024-06-12 NOTE — Discharge Instructions (Addendum)
 If symptoms or not improving in the next couple days return to the ER.  If improving follow-up with a dentist.

## 2024-07-19 ENCOUNTER — Emergency Department (HOSPITAL_COMMUNITY)
Admission: EM | Admit: 2024-07-19 | Discharge: 2024-07-19 | Disposition: A | Discharge status: Home / Self Care | Payer: Self-pay | Attending: Emergency Medicine | Admitting: Emergency Medicine

## 2024-07-19 ENCOUNTER — Emergency Department (HOSPITAL_COMMUNITY): Payer: Self-pay

## 2024-07-19 ENCOUNTER — Encounter (HOSPITAL_COMMUNITY): Payer: Self-pay

## 2024-07-19 DIAGNOSIS — S80212A Abrasion, left knee, initial encounter: Secondary | ICD-10-CM | POA: Insufficient documentation

## 2024-07-19 DIAGNOSIS — H05232 Hemorrhage of left orbit: Secondary | ICD-10-CM

## 2024-07-19 DIAGNOSIS — W19XXXA Unspecified fall, initial encounter: Secondary | ICD-10-CM

## 2024-07-19 DIAGNOSIS — S01112A Laceration without foreign body of left eyelid and periocular area, initial encounter: Secondary | ICD-10-CM | POA: Insufficient documentation

## 2024-07-19 DIAGNOSIS — Y9301 Activity, walking, marching and hiking: Secondary | ICD-10-CM | POA: Insufficient documentation

## 2024-07-19 DIAGNOSIS — Z23 Encounter for immunization: Secondary | ICD-10-CM | POA: Insufficient documentation

## 2024-07-19 DIAGNOSIS — W01198A Fall on same level from slipping, tripping and stumbling with subsequent striking against other object, initial encounter: Secondary | ICD-10-CM | POA: Insufficient documentation

## 2024-07-19 MED ORDER — TETANUS-DIPHTH-ACELL PERTUSSIS 5-2.5-18.5 LF-MCG/0.5 IM SUSY
PREFILLED_SYRINGE | INTRAMUSCULAR | Status: AC
  Filled 2024-07-19: qty 0.5

## 2024-07-19 MED ORDER — BACITRACIN ZINC 500 UNIT/GM EX OINT
TOPICAL_OINTMENT | Freq: Two times a day (BID) | CUTANEOUS | Status: DC
  Administered 2024-07-19: 1 via TOPICAL
  Filled 2024-07-19: qty 0.9

## 2024-07-19 MED ORDER — TETANUS-DIPHTH-ACELL PERTUSSIS 5-2.5-18.5 LF-MCG/0.5 IM SUSY
0.5000 mL | PREFILLED_SYRINGE | Freq: Once | INTRAMUSCULAR | Status: AC
  Administered 2024-07-19: 0.5 mL via INTRAMUSCULAR
  Filled 2024-07-19: qty 0.5

## 2024-07-19 MED ORDER — OXYCODONE-ACETAMINOPHEN 5-325 MG PO TABS
1.0000 | ORAL_TABLET | Freq: Once | ORAL | Status: AC
  Administered 2024-07-19: 1 via ORAL
  Filled 2024-07-19: qty 1

## 2024-07-19 MED ORDER — LIDOCAINE HCL (PF) 1 % IJ SOLN
5.0000 mL | Freq: Once | INTRAMUSCULAR | Status: AC
  Administered 2024-07-19: 5 mL
  Filled 2024-07-19: qty 30

## 2024-07-19 NOTE — ED Provider Notes (Signed)
  EMERGENCY DEPARTMENT AT [facility] Provider Note   CSN: [id number] Arrival date & time: 07/19/24  9152     Patient presents with: Michele Vazquez is a 53 y.o. female presents the emerged part today for evaluation after mechanical fall.  Patient reports that she was walking her dog when someone was riding her bike near the dog and the dog decided to chase the bike causing her to fall.  She reports that she slipped and fell and hitting her face on the concrete.  Denies any loss of consciousness.  Reports that it did break her glasses.  She does not have any visual changes or changes different than what she would be having without her glasses.  She reports some left-sided facial pain but no actual eyeball pain.  Unsure when her last tetanus was but thinks it was years ago.  She denies any other injury or pain.  Denies any chest, belly, back, upper or lower extremity pain.  No blood thinner use.  No known drug allergies.  Daily tobacco use.   Fall Pertinent negatives include no chest pain, no abdominal pain, no headaches and no shortness of breath.       Prior to Admission medications   Medication Sig Start Date End Date Taking? Authorizing Provider  amoxicillin -clavulanate (AUGMENTIN ) 875-125 MG tablet Take 1 tablet by mouth every 12 (twelve) hours. 06/12/24   Patsey Lot, MD  methocarbamol (ROBAXIN) 500 MG tablet Take 1 tablet as needed by oral route at bedtime for 30 days. 12/21/22   [provider]  mupirocin  ointment (BACTROBAN ) 2 % Apply 1 Application topically 2 (two) times daily. To affected area till better 04/22/23   Vonna Sharlet POUR, MD  oxyCODONE -acetaminophen  (PERCOCET/ROXICET) 5-325 MG tablet Take 1-2 tablets by mouth every 8 (eight) hours as needed for severe pain (pain score 7-10). 06/12/24   Patsey Lot, MD    Allergies: Patient has no known allergies.    Review of Systems  Constitutional:  Negative for chills and fever.   HENT:  Negative for congestion and rhinorrhea.   Eyes:  Negative for photophobia, pain and visual disturbance.  Respiratory:  Negative for cough and shortness of breath.   Cardiovascular:  Negative for chest pain.  Gastrointestinal:  Negative for abdominal pain.  Musculoskeletal:  Negative for back pain and neck pain.  Neurological:  Negative for dizziness, syncope, light-headedness and headaches.    Updated Vital Signs BP 127/77 (BP Location: Left Arm)   Pulse 92   Temp 98 F (36.7 C) (Oral)   Resp 18   LMP 05/13/2016 (Exact Date)   SpO2 96%   Physical Exam Vitals and nursing note reviewed.  Constitutional:      General: She is not in acute distress.    Appearance: She is not toxic-appearing.  HENT:     Head:      Comments: Patient has some swelling to the left eyebrow.  Soft.  Does have some overlying abrasion.  PERRLA.  EOMI.  No pain with movement of eyes.  No battle signs, does have some hematoma/bruising to the left upper lid but nothing present in the lower lids.    Nose:     Comments: No septal hematoma.  Superficial abrasion to the bridge of the nose    Mouth/Throat:     Comments: Poor dentition noted throughout.  No pain with clenching of the jaw.  Controlling secretions.  Normal speech.  No trismus.  No pain with opening  closing of the mouth Eyes:     General: No scleral icterus.    Extraocular Movements: Extraocular movements intact.     Pupils: Pupils are equal, round, and reactive to light.     Comments: Small laceration seen to the medial left eyebrow.  No proptosis.  Pulmonary:     Effort: Pulmonary effort is normal. No respiratory distress.  Abdominal:     Palpations: Abdomen is soft.     Tenderness: There is no abdominal tenderness. There is no guarding or rebound.  Musculoskeletal:     Cervical back: Normal range of motion. No tenderness.     Right lower leg: No edema.     Left lower leg: No edema.     Comments: Superficial abrasion noted to the left  knee.  Not bleeding.  No pain with flexion extension.  No tenderness on palp palpation.  Neurovascular intact distally.  Skin:    General: Skin is warm and dry.  Neurological:     General: No focal deficit present.     Mental Status: She is alert.     GCS: GCS eye subscore is 4. GCS verbal subscore is 5. GCS motor subscore is 6.     Cranial Nerves: No cranial nerve deficit.     Sensory: No sensory deficit.     Motor: No weakness.     (all labs ordered are listed, but only abnormal results are displayed) Labs Reviewed - No data to display  EKG: None  Radiology: CT Cervical Spine Wo Contrast Result Date: 07/19/2024 EXAM: CT CERVICAL SPINE WITHOUT CONTRAST 07/19/2024 09:49:00 AM TECHNIQUE: CT of the cervical spine was performed without the administration of intravenous contrast. Multiplanar reformatted images are provided for review. Automated exposure control, iterative reconstruction, and/or weight based adjustment of the mA/kV was utilized to reduce the radiation dose to as low as reasonably achievable. COMPARISON: None available. CLINICAL HISTORY: Neck trauma, dangerous injury mechanism (Age 98-63y). Per triage notes: Pt was walking her dog on the leash and dog took off running after someone and pt fell on the concrete face first, denies any LOC. Pt c/o left eye/face pain. FINDINGS: CERVICAL SPINE: BONES AND ALIGNMENT: No acute fracture or traumatic malalignment. DEGENERATIVE CHANGES: Endplate degenerative changes and uncovertebral spurring is present at C5-6 and C6-7 with moderate left foraminal stenosis at C5-6 and moderate foraminal narrowing bilaterally at C6-7. SOFT TISSUES: No prevertebral soft tissue swelling. IMPRESSION: 1. No acute abnormality of the cervical spine related to the reported neck trauma. 2. Endplate degenerative changes and uncovertebral spurring at C5-6 and C6-7 with moderate left foraminal stenosis at C5-6 and moderate foraminal narrowing bilaterally at C6-7.  Electronically signed by: Lonni Necessary MD 07/19/2024 10:01 AM EDT RP Workstation: [id number]   CT Head Wo Contrast Result Date: 07/19/2024 EXAM: CT HEAD WITHOUT CONTRAST 07/19/2024 09:49:00 AM TECHNIQUE: CT of the head was performed without the administration of intravenous contrast. Automated exposure control, iterative reconstruction, and/or weight based adjustment of the mA/kV was utilized to reduce the radiation dose to as low as reasonably achievable. COMPARISON: None available. CLINICAL HISTORY: Head trauma, moderate-severe. Per triage notes: Pt was walking her dog on the leash and dog took off running after someone and pt fell on the concrete face first, denies any LOC. Pt c/o left eye/face pain. No blood thinners. FINDINGS: BRAIN AND VENTRICLES: No acute hemorrhage. Gray-white differentiation is preserved. No hydrocephalus. No extra-axial collection. No mass effect or midline shift. ORBITS: Left periorbital hematoma is present without underlying fracture. The globes  and orbits are otherwise within normal limits. SINUSES: No acute abnormality. SOFT TISSUES AND SKULL: No acute soft tissue abnormality. No skull fracture. IMPRESSION: 1. Left periorbital hematoma without underlying fracture. Electronically signed by: Lonni Necessary MD 07/19/2024 09:58 AM EDT RP Workstation: [id number]   CT Maxillofacial Wo Contrast Result Date: 07/19/2024 EXAM: CT OF THE FACE WITHOUT CONTRAST 07/19/2024 09:49:00 AM TECHNIQUE: CT of the face was performed without the administration of intravenous contrast. Multiplanar reformatted images are provided for review. Automated exposure control, iterative reconstruction, and/or weight based adjustment of the mA/kV was utilized to reduce the radiation dose to as low as reasonably achievable. COMPARISON: None available. CLINICAL HISTORY: Facial trauma, blunt. Per triage notes: Pt was walking her dog on the leash and dog took off running after someone and pt fell on the  concrete face first, denies any LOC. Pt c/o left eye/face pain. No blood thinners. FINDINGS: FACIAL BONES: No acute facial fracture. No mandibular dislocation. No suspicious bone lesion. ORBITS: A left periorbital hematoma is present. No underlying fracture or foreign body is present. SINUSES AND MASTOIDS: No acute abnormality. SOFT TISSUES: No acute abnormality. IMPRESSION: 1. Left periorbital hematoma without underlying fracture or foreign body. Electronically signed by: Lonni Necessary MD 07/19/2024 09:56 AM EDT RP Workstation: [id number]   .Laceration Repair  Date/Time: 07/19/2024 12:34 PM  Performed by: Bernis Ernst, PA-C Authorized by: Bernis Ernst, PA-C   Consent:    Consent obtained:  Verbal   Consent given by:  Patient   Risks, benefits, and alternatives were discussed: yes     Risks discussed:  Infection, pain, nerve damage, need for additional repair, retained foreign body, poor cosmetic result and tendon damage   Alternatives discussed:  No treatment Universal protocol:    Procedure explained and questions answered to patient or proxy's satisfaction: yes     Imaging studies available: yes     Patient identity confirmed:  Verbally with patient Anesthesia:    Anesthesia method:  Local infiltration   Local anesthetic:  Lidocaine  1% w/o epi Laceration details:    Location:  Face   Face location:  L eyebrow   Length (cm):  1 Pre-procedure details:    Preparation:  Imaging obtained to evaluate for foreign bodies and patient was prepped and draped in usual sterile fashion Exploration:    Wound exploration: wound explored through full range of motion and entire depth of wound visualized     Contaminated: no   Treatment:    Area cleansed with:  Saline   Amount of cleaning:  Standard   Irrigation solution:  Sterile saline Skin repair:    Repair method:  Sutures   Suture size:  5-0   Wound skin closure material used: Vicryl.   Suture technique:  Simple interrupted    Number of sutures:  3 Approximation:    Approximation:  Close Repair type:    Repair type:  Simple Post-procedure details:    Dressing:  Antibiotic ointment   Procedure completion:  Tolerated well, no immediate complications    Medications Ordered in the ED  Tdap (BOOSTRIX ) 5-2.5-18.5 LF-MCG/0.5 injection ( Intramuscular Not Given 07/19/24 1008)  bacitracin  ointment (1 Application Topical Given 07/19/24 1240)  Tdap (BOOSTRIX ) injection 0.5 mL (0.5 mLs Intramuscular Given 07/19/24 1003)  oxyCODONE -acetaminophen  (PERCOCET/ROXICET) 5-325 MG per tablet 1 tablet (1 tablet Oral Given 07/19/24 1002)  lidocaine  (PF) (XYLOCAINE ) 1 % injection 5 mL (5 mLs Other Given 07/19/24 1210)  Medical Decision Making Amount and/or Complexity of Data Reviewed Radiology: ordered.  Risk OTC drugs. Prescription drug management.   53 y.o. female presents to the ER for evaluation of mechanical fall. Differential diagnosis includes but is not limited to  Bony trauma, soft tissue injury. Vital signs unremarkable. Physical exam as noted above.   Patient not having any midline or paraspinal tenderness however given age will order CT C-spine.  CT head and max face ordered as well.  Tetanus updated given unknown last.  She has no battle signs or raccoon eyes.  CT Head/Maxillofacial 1. Left periorbital hematoma without underlying fracture. Per radiologist's interpretation.    CT Cervical 1. No acute abnormality of the cervical spine related to the reported neck trauma. 2. Endplate degenerative changes and uncovertebral spurring at C5-6 and C6-7 with moderate left foraminal stenosis at C5-6 and moderate foraminal narrowing bilaterally at C6-7. Per radiologist's interpretation.    Please see his procedure note for eyebrow laceration repair.  Patient having significant treatment of symptoms with pain medication.  Will recommend Tylenol  ibuprofen  outpatient.  I use dissolvable sutures the  patient will not have to return.  She is not having any eyeball pain and her vision is at its baseline that she has without her glasses.  Do not think fluorescein exam is needed at this time.  No proptosis.  We discussed laceration care and return precautions red flag symptoms.  Patient will need follow-up with her PCP for reevaluation.  Discussed about icing the affected area 15 minutes every few hours to help with the pain and swelling.  She is hemodynamically stable, will be discharged home.  We discussed the results of the labs/imaging. The plan is wound cleaning, rest, ice, supportive care. We discussed strict return precautions and red flag symptoms. The patient verbalized their understanding and agrees to the plan. The patient is stable and being discharged home in good condition.  Portions of this report may have been transcribed using voice recognition software. Every effort was made to ensure accuracy; however, inadvertent computerized transcription errors may be present.    Final diagnoses:  Fall, initial encounter  Periorbital hematoma of left eye  Laceration of left eyebrow, initial encounter    ED Discharge Orders     None          Bernis Ernst, DEVONNA 07/19/24 1253    Doretha Folks, MD 07/22/24 [phone]

## 2024-07-19 NOTE — Discharge Instructions (Addendum)
 You were seen in the Emergency Department today for evaluation of your laceration and fall. Your CT shows that you have bruising around your eye, no fractures. I am glad we were able to repair the laceration for you. Please make sure that you are remembering to keep your wound clean daily with Dial soap and water and daily bandage changes. I recommend keeping the wound covered for the next 48-72 hours and then to your comfort afterwards. Do not expose the wound to any dishwater, pools, lakes, oceans, [employer], dirt or grime. Keeping the wound clean and away from contamination can help ensure good wound healing and help to prevent infections. Your sutures will fall out in the next 1-2 weeks. For pain, I recommend Tylenol  1000mg  and/or ibuprofen  600mg  every 6 hours as needed for pain.Let your PCP know that we updated your tetanus shot for you today. If you have any concerns, new or worsening symptoms, please return to the nearest ER for re-evaluation.   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 Please follow up with your PCP on this.   Contact a doctor if: You got a tetanus shot and you have any of these problems where the needle went in: Swelling. Very bad pain. Redness. Bleeding. A wound that was closed breaks open. You have a fever. You have any of these signs of infection in your wound: More redness, swelling, or pain. Fluid or blood. Warmth. Pus or a bad smell. You see something coming out of the wound, such as wood or glass. Medicine does not make your pain go away. You notice a change in the color of your skin near your wound. You need to change the bandage often. You have a new rash. You lose feeling (have numbness) around the wound. Get help right away if: You have very bad swelling around the wound. Your pain suddenly gets worse and is very bad. You have painful lumps near the wound or on skin anywhere on your body. You have a red streak going away from your wound. The  wound is on your hand or foot, and: You cannot move a finger or toe. Your fingers or toes look pale or bluish.

## 2024-07-19 NOTE — ED Triage Notes (Signed)
 Pt was walking her dog on the leash and dog took off running after someone and pt fell on the concrete face first, denies any LOC. Pt c/o left eye/face pain. No blood thinners.

## 2024-07-28 ENCOUNTER — Emergency Department (HOSPITAL_COMMUNITY)
Admission: EM | Admit: 2024-07-28 | Discharge: 2024-07-28 | Disposition: A | Discharge status: Home / Self Care | Payer: Self-pay | Attending: Emergency Medicine | Admitting: Emergency Medicine

## 2024-07-28 ENCOUNTER — Other Ambulatory Visit: Payer: Self-pay

## 2024-07-28 ENCOUNTER — Encounter (HOSPITAL_COMMUNITY): Payer: Self-pay

## 2024-07-28 DIAGNOSIS — H05232 Hemorrhage of left orbit: Secondary | ICD-10-CM

## 2024-07-28 DIAGNOSIS — S0012XD Contusion of left eyelid and periocular area, subsequent encounter: Secondary | ICD-10-CM | POA: Insufficient documentation

## 2024-07-28 DIAGNOSIS — W19XXXD Unspecified fall, subsequent encounter: Secondary | ICD-10-CM | POA: Insufficient documentation

## 2024-07-28 NOTE — ED Triage Notes (Signed)
 Pt injured her left eye over a week ago and feels that it is not getting better. Pt reports that it is still swollen and she has some eye irritation.

## 2024-07-28 NOTE — Discharge Instructions (Addendum)
 Your eye looks like it is healing well. Please continue to take care of it as you have been. Feel better soon!

## 2024-07-28 NOTE — ED Provider Notes (Signed)
  [place] EMERGENCY DEPARTMENT AT [facility] Provider Note   CSN: [id number] Arrival date & time: 07/28/24  9242   Patient presents with: Eye Problem   Michele Vazquez is a 53 y.o. female.   Seen on 8/10 for fall and eye injury Since then much of swelling has improved L eyelid still feels puffy, heavy Notes intermittent white cloudiness, resolves with rubbing eyes or eye drops No dark vision changes or loss of vision No bleeding or drainage No injury since last ED visit      Prior to Admission medications   Medication Sig Start Date End Date Taking? Authorizing Provider  amoxicillin -clavulanate (AUGMENTIN ) 875-125 MG tablet Take 1 tablet by mouth every 12 (twelve) hours. 06/12/24   Patsey Lot, MD  methocarbamol (ROBAXIN) 500 MG tablet Take 1 tablet as needed by oral route at bedtime for 30 days. 12/21/22   [provider]  mupirocin  ointment (BACTROBAN ) 2 % Apply 1 Application topically 2 (two) times daily. To affected area till better 04/22/23   Vonna Sharlet POUR, MD  oxyCODONE -acetaminophen  (PERCOCET/ROXICET) 5-325 MG tablet Take 1-2 tablets by mouth every 8 (eight) hours as needed for severe pain (pain score 7-10). 06/12/24   Patsey Lot, MD    Allergies: Patient has no known allergies.    Review of Systems  Constitutional:  Negative for fever.  Eyes:  Positive for pain and visual disturbance. Negative for discharge.    Updated Vital Signs BP 135/82 (BP Location: Left Arm)   Pulse 90   Temp 98.6 F (37 C) (Oral)   Resp 18   Ht 5' 5 (1.651 m)   Wt 70.3 kg   LMP 05/13/2016 (Exact Date)   SpO2 96%   BMI 25.79 kg/m   Physical Exam Constitutional:      General: She is not in acute distress. Eyes:     General: No scleral icterus.       Right eye: No discharge.        Left eye: No discharge.     Extraocular Movements: Extraocular movements intact.     Conjunctiva/sclera: Conjunctivae normal.     Pupils: Pupils are equal, round,  and reactive to light.     Comments: Abrasion of L eyebrow with underlying soft, tender nodule. L eyelid with edema and bruising in various stages of healing.    Neurological:     Mental Status: She is alert.    EKG: None  Radiology: No results found.  Medications Ordered in the ED - No data to display    Medical Decision Making  Patient with known L periorbital hematoma presenting for follow up today. Recent ED visit 8/10 for mechanical fall, with head/neck imaging remarkable for L periorbital hematoma, no other acute findings. Patient appears stable and recovering well on exam today. Discussed continued supportive care measures at home. Patient stable for discharge.   Final diagnoses:  Periorbital hematoma of left eye    ED Discharge Orders     None           Diona Perkins, MD 07/28/24 1020    Patt Alm Macho, MD 07/28/24 [phone]
